# Patient Record
Sex: Female | Born: 1975 | Race: White | Hispanic: No | Marital: Married | Smoking: Never smoker
Health system: Southern US, Community
[De-identification: ages and names within clinical notes are randomized; demographics above are authoritative.]

## PROBLEM LIST (undated history)

## (undated) DIAGNOSIS — Z973 Presence of spectacles and contact lenses: Secondary | ICD-10-CM

## (undated) DIAGNOSIS — R102 Pelvic and perineal pain: Secondary | ICD-10-CM

## (undated) DIAGNOSIS — K760 Fatty (change of) liver, not elsewhere classified: Secondary | ICD-10-CM

## (undated) DIAGNOSIS — N920 Excessive and frequent menstruation with regular cycle: Secondary | ICD-10-CM

## (undated) DIAGNOSIS — R42 Dizziness and giddiness: Secondary | ICD-10-CM

## (undated) DIAGNOSIS — G43909 Migraine, unspecified, not intractable, without status migrainosus: Secondary | ICD-10-CM

## (undated) HISTORY — PX: MOUTH SURGERY: SHX715

## (undated) HISTORY — PX: WISDOM TOOTH EXTRACTION: SHX21

## (undated) HISTORY — DX: Fatty (change of) liver, not elsewhere classified: K76.0

## (undated) HISTORY — DX: Migraine, unspecified, not intractable, without status migrainosus: G43.909

## (undated) HISTORY — PX: NOSE SURGERY: SHX723

## (undated) HISTORY — PX: APPENDECTOMY: SHX54

## (undated) HISTORY — DX: Dizziness and giddiness: R42

## (undated) HISTORY — PX: OTHER SURGICAL HISTORY: SHX169

## (undated) HISTORY — PX: TONSILLECTOMY: SUR1361

---

## 2000-01-21 HISTORY — PX: APPENDECTOMY: SHX54

## 2006-12-03 ENCOUNTER — Encounter: Admission: RE | Admit: 2006-12-03 | Discharge: 2006-12-03 | Payer: Self-pay | Admitting: Obstetrics & Gynecology

## 2006-12-22 ENCOUNTER — Encounter: Admission: RE | Admit: 2006-12-22 | Discharge: 2006-12-22 | Payer: Self-pay | Admitting: Obstetrics & Gynecology

## 2007-06-28 ENCOUNTER — Encounter: Admission: RE | Admit: 2007-06-28 | Discharge: 2007-06-28 | Payer: Self-pay | Admitting: Obstetrics & Gynecology

## 2008-01-10 ENCOUNTER — Ambulatory Visit: Payer: Self-pay | Admitting: Genetic Counselor

## 2008-01-25 ENCOUNTER — Encounter: Admission: RE | Admit: 2008-01-25 | Discharge: 2008-01-25 | Payer: Self-pay | Admitting: Obstetrics & Gynecology

## 2008-03-08 ENCOUNTER — Ambulatory Visit: Payer: Self-pay | Admitting: Genetic Counselor

## 2010-02-12 ENCOUNTER — Encounter
Admission: RE | Admit: 2010-02-12 | Discharge: 2010-02-12 | Payer: Self-pay | Source: Home / Self Care | Attending: Obstetrics & Gynecology | Admitting: Obstetrics & Gynecology

## 2012-03-08 ENCOUNTER — Other Ambulatory Visit: Payer: Self-pay | Admitting: Obstetrics & Gynecology

## 2012-03-08 DIAGNOSIS — Z1231 Encounter for screening mammogram for malignant neoplasm of breast: Secondary | ICD-10-CM

## 2012-04-06 ENCOUNTER — Ambulatory Visit: Payer: Self-pay

## 2012-04-29 ENCOUNTER — Ambulatory Visit
Admission: RE | Admit: 2012-04-29 | Discharge: 2012-04-29 | Disposition: A | Discharge status: Home / Self Care | Payer: Managed Care, Other (non HMO) | Source: Ambulatory Visit | Attending: Obstetrics & Gynecology | Admitting: Obstetrics & Gynecology

## 2012-04-29 DIAGNOSIS — Z1231 Encounter for screening mammogram for malignant neoplasm of breast: Secondary | ICD-10-CM

## 2012-09-30 ENCOUNTER — Encounter: Payer: Self-pay | Admitting: Nurse Practitioner

## 2012-10-01 ENCOUNTER — Encounter: Payer: Self-pay | Admitting: Neurology

## 2012-10-01 ENCOUNTER — Ambulatory Visit (INDEPENDENT_AMBULATORY_CARE_PROVIDER_SITE_OTHER): Payer: Managed Care, Other (non HMO) | Admitting: Neurology

## 2012-10-01 VITALS — BP 117/76 | HR 59 | Ht 65.5 in | Wt 150.0 lb

## 2012-10-01 DIAGNOSIS — G43909 Migraine, unspecified, not intractable, without status migrainosus: Secondary | ICD-10-CM | POA: Insufficient documentation

## 2012-10-01 DIAGNOSIS — R002 Palpitations: Secondary | ICD-10-CM

## 2012-10-01 DIAGNOSIS — R42 Dizziness and giddiness: Secondary | ICD-10-CM

## 2012-10-01 NOTE — Patient Instructions (Addendum)
Overall you are doing fairly well but I do want to suggest a few things today:   Remember to drink plenty of fluid, eat healthy meals and do not skip any meals. Try to eat protein with a every meal and eat a healthy snack such as fruit or nuts in between meals. Try to keep a regular sleep-wake schedule and try to exercise daily, particularly in the form of walking, 20-30 minutes a day, if you can.   As far as diagnostic testing: I would like to check your thyroid today. I also suggest you follow up with cardiology.  I would like to see you back once your workup is completed, sooner if we need to. Please call us with any interim questions, concerns, problems, updates or refill requests.   Please also call us for any test results so we can go over those with you on the phone.  My clinical assistant and will answer any of your questions and relay your messages to me and also relay most of my messages to you.   Our phone number is [phone]. We also have an after hours call service for urgent matters and there is a physician on-call for urgent questions. For any emergencies you know to call 911 or go to the nearest emergency room

## 2012-10-01 NOTE — Progress Notes (Signed)
[facility]  Provider:  Dr Hosie Poisson Referring Provider: Serena Colonel, MD Primary Care Physician:  Sissy Hoff, MD  CC:  Frequent episodes of lightheadedness   HPI:  Kirsten Coleman is a 37 y.o. female here as a referral from Dr. Pollyann Kennedy for evaluation of lightheadedness. Was seen by ENT with no signs of an inner ear problem.   Symptoms started in July, starts as sensation of feeling fatigued and feel like she is going to pass out, happens randomly throughout the day, during nighttime also. Now happening 1-2 times per week. Typically last 1-3 minutes, has never last longer than 5 minutes. During event she feels disconnected. Will occasionally have episodes of blurry vision but not tied to the event. She does note some palpitations and sensation of feeling her heart beat during these events. Feels very tired after these events. During the event she can appropriately interact with people, no automatisms. She feels warm during the events flushed. No history of migraine. Will occasionally notice a tension type headache after the event. No associated photo or phonophobia, no nausea or vomiting. No sensory or motor changes during the events. Denies any triggers. During the event she sits down and wait until it is over. Has not actually ever passed out during the event.  Doesn't drink caffeine. No change in medications.   Otherwise healthy. No history of heart problems, no migraines. No excessive stress, depression, anxiety.   Review of Systems: Out of a complete 14 system review, the patient complains of only the following symptoms, and all other reviewed systems are negative. Positive for blurred vision fatigue palpitations dizziness numbness headache decreased energy  History   Social History  . Marital Status: Married    Spouse Name: [name]    Number of Children: 2  . Years of Education: BS   Occupational History  .      [employer]   Social History Main Topics  .  Smoking status: Never Smoker   . Smokeless tobacco: Never Used  . Alcohol Use: 0.6 oz/week    1 Glasses of wine per week     Comment: social   . Drug Use: No  . Sexual Activity: Not on file   Other Topics Concern  . Not on file   Social History Narrative   Patient lives at home with her husband [name]). Patient has college education. B.S.   Caffeine- None-   Right handed.    Family History  Problem Relation Age of Onset  . Alzheimer's disease      grandfather  . Hypertension Father   . Lung cancer      uncle  . Rheum arthritis      grandmother    Past Medical History  Diagnosis Date  . Light headedness   . Migraine     Past Surgical History  Procedure Laterality Date  . Appendectomy    . Nose surgery    . Mouth surgery    . Tonsillectomy      Current Outpatient Prescriptions  Medication Sig Dispense Refill  . Multiple Vitamins-Minerals (MULTIVITAMIN PO) Take by mouth daily.       No current facility-administered medications for this visit.    Allergies as of 10/01/2012 - Review Complete 10/01/2012  Allergen Reaction Noted  . Bactrim [sulfamethoxazole w-trimethoprim]  09/30/2012    Vitals: BP 117/76  Pulse 59  Ht 5' 5.5" (1.664 m)  Wt 150 lb (68.04 kg)  BMI 24.57 kg/m2 Last Weight:  Wt Readings from  Last 1 Encounters:  10/01/12 150 lb (68.04 kg)   Last Height:   Ht Readings from Last 1 Encounters:  10/01/12 5' 5.5" (1.664 m)     Physical exam: Exam: Gen: NAD, conversant Eyes: anicteric sclerae, moist conjunctivae HENT: [place] Neck: Trachea midline; supple,  Lungs: CTA, no wheezing, rales, rhonic                          CV: RRR, no MRG Abdomen: Soft, non-tender;  Extremities: No peripheral edema  Skin: Normal temperature, no rash,  Psych: Appropriate affect, pleasant  Neuro: MS: AA&Ox3, appropriately interactive, normal affect   Attention: WORLD backwards  Speech: fluent w/o paraphasic error  Memory: good recent and remote  recall  CN: PERRL, EOMI no nystagmus,VFF to FC bilat,  no ptosis, sensation intact to LT V1-V3 bilat, face symmetric, no weakness, hearing grossly intact, palate elevates symmetrically, shoulder shrug 5/5 bilat,  tongue protrudes midline, no fasiculations noted.  Motor: normal bulk and tone Strength: 5/5  In all extremities  Coord: rapid alternating and point-to-point (FNF, HTS) movements intact.  Reflexes: symmetrical, bilat downgoing toes  Sens: LT intact in all extremities  Gait: posture, stance, stride and arm-swing normal. Tandem gait intact. Able to walk on heels and toes. Romberg absent.   Assessment:  After physical and neurologic examination, review of laboratory studies, imaging, neurophysiology testing and pre-existing records, assessment will be reviewed on the problem list.  Plan:  Treatment plan and additional workup will be reviewed under Problem List.  [name] is a pleasant 37 year old woman who presents for initial evaluation of episodes of lightheadedness. These began in July, last around 1-3 minutes and have been occurring at a frequency of one to 2 times per week. With these events feel that she is going to pass out, feels flushed and gets palpitations. Occasional has a tension type headache after the event. No history of migraines. Was evaluated by ENT with normal exam. Physical exam. Unremarkable. No signs of orthostatic changes in blood pressure. Unclear etiology of these events. With history of palpitations in subjective awareness of her heartbeat would consider cardiac etiology. Will check thyroid level today, consultation to followup with cardiology for possible Holter monitoring. This potentially could represent an atypical migraine, though would want to rule out cardiac etiology prior to making this diagnosis.  1) lightheadedness: r/o cardiac etiology vs thryoid vs possible atypical migraine  -check TSH  -follow up with cardiology -with above normal would  consider atypical migraine and discuss treatment options with patient

## 2012-10-11 ENCOUNTER — Telehealth: Payer: Self-pay | Admitting: Neurology

## 2012-10-13 ENCOUNTER — Other Ambulatory Visit: Payer: Self-pay | Admitting: Neurology

## 2012-10-13 DIAGNOSIS — R002 Palpitations: Secondary | ICD-10-CM

## 2012-10-13 NOTE — Telephone Encounter (Signed)
Called patient back and placed referral for cardiology

## 2012-10-13 NOTE — Telephone Encounter (Signed)
I called and LMVM for pt that last office notes states relating to TSH (normal) and then cardiology for holter monitoring?  This already scheduled from before? TBS?

## 2012-11-10 ENCOUNTER — Ambulatory Visit: Payer: Managed Care, Other (non HMO) | Admitting: Cardiology

## 2012-11-12 ENCOUNTER — Encounter: Payer: Self-pay | Admitting: Cardiology

## 2012-11-12 ENCOUNTER — Ambulatory Visit (INDEPENDENT_AMBULATORY_CARE_PROVIDER_SITE_OTHER): Payer: Managed Care, Other (non HMO) | Admitting: Cardiology

## 2012-11-12 VITALS — BP 110/82 | HR 66 | Ht 65.5 in | Wt 152.0 lb

## 2012-11-12 DIAGNOSIS — R55 Syncope and collapse: Secondary | ICD-10-CM

## 2012-11-12 NOTE — Progress Notes (Signed)
Patient ID: Gelisa Tieken, female   DOB: 1975/03/22, 37 y.o.   MRN: [id number]    Patient Name: Kirsten Coleman Date of Encounter: 11/12/2012  Primary Care Provider:  Sissy Hoff, MD Primary Cardiologist:  Tobias Alexander, H  Patient Profile  Palpitation  Problem List   Past Medical History  Diagnosis Date  . Light headedness   . Migraine    Past Surgical History  Procedure Laterality Date  . Appendectomy    . Nose surgery    . Mouth surgery    . Tonsillectomy      Allergies  Allergies  Allergen Reactions  . 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 The patient states that starting July she started to have episodes of sudden onset blurred vision, weakness, and feeling that she is going to pass out. No syncopal episodes. They are always followed by a headache. The last from 1 minute to 15 minutes. No obvious precipitating or alleviating factors. It can happen at any time while she's driving or at night even when she is swimming. They have been approximately every 2-3 days. They are not associated with shortness of breath or chest pain. No palpitations. One time at work when she felt like this her coworkers checked her blood pressure heart rate and blood sugar ankles all normal.  Home Medications  Prior to Admission medications   Medication Sig Start Date End Date Taking? Authorizing Provider  Multiple Vitamins-Minerals (MULTIVITAMIN PO) Take by mouth daily.   Yes Historical Provider, MD   Family History  Family History  Problem Relation Age of Onset  . Alzheimer's disease      grandfather  . Hypertension Father   . Lung cancer      uncle  . Rheum arthritis      grandmother    Social History  History   Social History  . Marital Status: Married    Spouse Name: [name]    Number of Children: 2  . Years of Education: BS   Occupational History  .     [employer]   Social History Main Topics  . Smoking status: Never Smoker   . Smokeless tobacco: Never Used  . Alcohol Use: 0.6 oz/week    1 Glasses of wine per week     Comment: social   . Drug Use: No  . Sexual Activity: Not on file   Other Topics Concern  . Not on file   Social History Narrative   Patient lives at home with her husband [name]). Patient has college education. B.S.   Caffeine- None-   Right handed.    Review of Systems, as per history of present illness otherwise negative General:  No chills, fever, night sweats or weight changes.  Cardiovascular:  No chest pain, dyspnea on exertion, edema, orthopnea, palpitations, paroxysmal nocturnal dyspnea. Dermatological: No rash, lesions/masses Respiratory: No cough, dyspnea Urologic: No hematuria, dysuria Abdominal:   No nausea, vomiting, diarrhea, bright red blood per rectum, melena, or hematemesis Neurologic:  No visual changes, wkns, changes in mental status. All other systems reviewed and are otherwise negative except as noted above.  Physical Exam  Blood pressure 110/82, pulse 66, height 5' 5.5" (1.664 m), weight 152 lb (68.947 kg).  General: Pleasant, NAD Psych: Normal affect. Neuro: Alert and oriented X 3. Moves all extremities spontaneously. HEENT: Normal  Neck: Supple without bruits or JVD. Lungs:  Resp regular and unlabored, CTA. Heart:  RRR no s3, s4, or murmurs. Abdomen: Soft, non-tender, non-distended, BS + x 4.  Extremities: No clubbing, cyanosis or edema. DP/PT/Radials 2+ and equal bilaterally.  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 It doesn't appeared to be symptoms are cardiac in origin but rather migraines with aura or seizure activity. She has a  normal cardiac exam, normal EKG. The most part the patient on cardiac monitor to monitor her EKG and until she has one or 2 of those episodes and we will review with possible underlying arrhythmias. If you rule out arrhythmia S. she will need to be followed by neurologist.  Once we review the cardio cardiac monitor we will call patient with the results and schedule another followup visit if necessary.   Tobias Alexander, Rexene Edison, MD 11/12/2012, 4:34 PM

## 2012-11-12 NOTE — Patient Instructions (Signed)
Your physician has recommended that you wear an event monitor. Event monitors are medical devices that record the heart's electrical activity. Doctors most often [place] these monitors to diagnose arrhythmias. Arrhythmias are problems with the speed or rhythm of the heartbeat. The monitor is a small, portable device. You can wear one while you do your normal daily activities. This is usually used to diagnose what is causing palpitations/syncope (passing out). Pt will wear until she has an event then take off and return monitor.  Your physician recommends that you schedule a follow-up appointment in: we will contact you concerning monitor

## 2012-11-15 ENCOUNTER — Encounter (INDEPENDENT_AMBULATORY_CARE_PROVIDER_SITE_OTHER): Payer: Managed Care, Other (non HMO)

## 2012-11-15 ENCOUNTER — Encounter: Payer: Self-pay | Admitting: Radiology

## 2012-11-15 DIAGNOSIS — R55 Syncope and collapse: Secondary | ICD-10-CM

## 2012-11-15 NOTE — Progress Notes (Signed)
Patient ID: Kirsten Coleman, female   DOB: 10-19-1975, 37 y.o.   MRN: [id number] lifewatch 30 day monitor applied

## 2012-11-25 ENCOUNTER — Other Ambulatory Visit: Payer: Self-pay

## 2012-12-20 ENCOUNTER — Telehealth: Payer: Self-pay

## 2012-12-20 NOTE — Telephone Encounter (Signed)
Pt is advised, per Dr Delton See, that her event monitor results were normal, she verbalized understanding. Per Pt request a copy of event mon. results have been faxed to Dr. Hosie Poisson at [phone].

## 2012-12-21 ENCOUNTER — Telehealth: Payer: Self-pay | Admitting: Neurology

## 2012-12-22 NOTE — Telephone Encounter (Signed)
Patient called back and recent cardiology results discussed. Will start patient on Petadolex capsules for migraine. Patient prefers naturopathic medications.

## 2014-01-06 ENCOUNTER — Other Ambulatory Visit: Payer: Self-pay | Admitting: Obstetrics & Gynecology

## 2014-01-06 DIAGNOSIS — N63 Unspecified lump in unspecified breast: Secondary | ICD-10-CM

## 2014-01-19 ENCOUNTER — Other Ambulatory Visit: Payer: Managed Care, Other (non HMO)

## 2014-01-26 ENCOUNTER — Ambulatory Visit
Admission: RE | Admit: 2014-01-26 | Discharge: 2014-01-26 | Disposition: A | Discharge status: Home / Self Care | Payer: Managed Care, Other (non HMO) | Source: Ambulatory Visit | Attending: Obstetrics & Gynecology | Admitting: Obstetrics & Gynecology

## 2014-01-26 DIAGNOSIS — N63 Unspecified lump in unspecified breast: Secondary | ICD-10-CM

## 2014-08-01 ENCOUNTER — Encounter: Payer: Self-pay | Admitting: Genetic Counselor

## 2014-10-30 ENCOUNTER — Other Ambulatory Visit: Payer: Self-pay | Admitting: Obstetrics & Gynecology

## 2014-10-31 ENCOUNTER — Encounter (HOSPITAL_COMMUNITY)
Admission: AD | Disposition: A | Discharge status: Home / Self Care | Payer: Self-pay | Source: Ambulatory Visit | Attending: Obstetrics & Gynecology

## 2014-10-31 ENCOUNTER — Ambulatory Visit (HOSPITAL_COMMUNITY)
Admission: AD | Admit: 2014-10-31 | Discharge: 2014-10-31 | Disposition: A | Discharge status: Home / Self Care | Payer: Managed Care, Other (non HMO) | Source: Ambulatory Visit | Attending: Obstetrics & Gynecology | Admitting: Obstetrics & Gynecology

## 2014-10-31 ENCOUNTER — Ambulatory Visit (HOSPITAL_COMMUNITY): Payer: Managed Care, Other (non HMO) | Admitting: Certified Registered Nurse Anesthetist

## 2014-10-31 ENCOUNTER — Encounter (HOSPITAL_COMMUNITY): Payer: Self-pay | Admitting: Certified Registered Nurse Anesthetist

## 2014-10-31 DIAGNOSIS — N840 Polyp of corpus uteri: Secondary | ICD-10-CM | POA: Diagnosis not present

## 2014-10-31 HISTORY — PX: DILITATION & CURRETTAGE/HYSTROSCOPY WITH VERSAPOINT RESECTION: SHX5571

## 2014-10-31 LAB — CBC
HEMATOCRIT: 41.7 % (ref 36.0–46.0)
HEMOGLOBIN: 13.8 g/dL (ref 12.0–15.0)
MCH: 30.9 pg (ref 26.0–34.0)
MCHC: 33.1 g/dL (ref 30.0–36.0)
MCV: 93.5 fL (ref 78.0–100.0)
Platelets: 220 10*3/uL (ref 150–400)
RBC: 4.46 MIL/uL (ref 3.87–5.11)
RDW: 13.7 % (ref 11.5–15.5)
WBC: 6.5 10*3/uL (ref 4.0–10.5)

## 2014-10-31 SURGERY — DILATATION & CURETTAGE/HYSTEROSCOPY WITH VERSAPOINT RESECTION
Anesthesia: General | Site: Vagina

## 2014-10-31 MED ORDER — HYDROCODONE-ACETAMINOPHEN 7.5-325 MG PO TABS
ORAL_TABLET | ORAL | Status: AC
  Filled 2014-10-31: qty 1

## 2014-10-31 MED ORDER — CEFAZOLIN SODIUM-DEXTROSE 2-3 GM-% IV SOLR
INTRAVENOUS | Status: AC
  Filled 2014-10-31: qty 50

## 2014-10-31 MED ORDER — MIDAZOLAM HCL 2 MG/2ML IJ SOLN
INTRAMUSCULAR | Status: DC | PRN
  Administered 2014-10-31: 2 mg via INTRAVENOUS

## 2014-10-31 MED ORDER — KETOROLAC TROMETHAMINE 30 MG/ML IJ SOLN
INTRAMUSCULAR | Status: DC | PRN
  Administered 2014-10-31: 30 mg via INTRAVENOUS

## 2014-10-31 MED ORDER — MEPERIDINE HCL 25 MG/ML IJ SOLN: 6.2500 mg | INTRAMUSCULAR | Status: DC | PRN

## 2014-10-31 MED ORDER — SCOPOLAMINE 1 MG/3DAYS TD PT72
1.0000 | MEDICATED_PATCH | Freq: Once | TRANSDERMAL | Status: DC
  Administered 2014-10-31: 1.5 mg via TRANSDERMAL

## 2014-10-31 MED ORDER — GLYCOPYRROLATE 0.2 MG/ML IJ SOLN
INTRAMUSCULAR | Status: DC | PRN
  Administered 2014-10-31 (×2): 0.1 mg via INTRAVENOUS

## 2014-10-31 MED ORDER — SCOPOLAMINE 1 MG/3DAYS TD PT72
MEDICATED_PATCH | TRANSDERMAL | Status: AC
  Administered 2014-10-31: 1.5 mg via TRANSDERMAL
  Filled 2014-10-31: qty 1

## 2014-10-31 MED ORDER — PROPOFOL 10 MG/ML IV BOLUS
INTRAVENOUS | Status: DC | PRN
  Administered 2014-10-31: 170 mg via INTRAVENOUS

## 2014-10-31 MED ORDER — CEFAZOLIN SODIUM-DEXTROSE 2-3 GM-% IV SOLR
2.0000 g | INTRAVENOUS | Status: AC
  Administered 2014-10-31: 2 g via INTRAVENOUS

## 2014-10-31 MED ORDER — PROPOFOL 10 MG/ML IV BOLUS
INTRAVENOUS | Status: AC
  Filled 2014-10-31: qty 20

## 2014-10-31 MED ORDER — LACTATED RINGERS IV SOLN
INTRAVENOUS | Status: DC
  Administered 2014-10-31: 125 mL/h via INTRAVENOUS

## 2014-10-31 MED ORDER — MIDAZOLAM HCL 2 MG/2ML IJ SOLN
INTRAMUSCULAR | Status: AC
  Filled 2014-10-31: qty 4

## 2014-10-31 MED ORDER — METOCLOPRAMIDE HCL 5 MG/ML IJ SOLN: 10.0000 mg | Freq: Once | INTRAMUSCULAR | Status: DC | PRN

## 2014-10-31 MED ORDER — FENTANYL CITRATE (PF) 100 MCG/2ML IJ SOLN
25.0000 ug | INTRAMUSCULAR | Status: DC | PRN
  Administered 2014-10-31: 50 ug via INTRAVENOUS

## 2014-10-31 MED ORDER — ONDANSETRON HCL 4 MG/2ML IJ SOLN
INTRAMUSCULAR | Status: AC
  Filled 2014-10-31: qty 2

## 2014-10-31 MED ORDER — KETOROLAC TROMETHAMINE 30 MG/ML IJ SOLN
INTRAMUSCULAR | Status: AC
Start: 2014-10-31 — End: 2014-10-31
  Filled 2014-10-31: qty 1

## 2014-10-31 MED ORDER — DEXAMETHASONE SODIUM PHOSPHATE 10 MG/ML IJ SOLN
INTRAMUSCULAR | Status: DC | PRN
  Administered 2014-10-31: 4 mg via INTRAVENOUS

## 2014-10-31 MED ORDER — ONDANSETRON HCL 4 MG/2ML IJ SOLN
INTRAMUSCULAR | Status: DC | PRN
  Administered 2014-10-31: 4 mg via INTRAVENOUS

## 2014-10-31 MED ORDER — FENTANYL CITRATE (PF) 100 MCG/2ML IJ SOLN
INTRAMUSCULAR | Status: DC | PRN
  Administered 2014-10-31: 100 ug via INTRAVENOUS

## 2014-10-31 MED ORDER — HYDROCODONE-ACETAMINOPHEN 7.5-325 MG PO TABS
1.0000 | ORAL_TABLET | Freq: Once | ORAL | Status: AC | PRN
  Administered 2014-10-31: 1 via ORAL

## 2014-10-31 MED ORDER — CHLOROPROCAINE HCL 1 % IJ SOLN
INTRAMUSCULAR | Status: AC
  Filled 2014-10-31: qty 30

## 2014-10-31 MED ORDER — SODIUM CHLORIDE 0.9 % IR SOLN
Status: DC | PRN
  Administered 2014-10-31: 3000 mL

## 2014-10-31 MED ORDER — FENTANYL CITRATE (PF) 100 MCG/2ML IJ SOLN
INTRAMUSCULAR | Status: AC
  Filled 2014-10-31: qty 2

## 2014-10-31 MED ORDER — DEXAMETHASONE SODIUM PHOSPHATE 4 MG/ML IJ SOLN
INTRAMUSCULAR | Status: AC
  Filled 2014-10-31: qty 1

## 2014-10-31 MED ORDER — HYDROCODONE-IBUPROFEN 5-200 MG PO TABS
1.0000 | ORAL_TABLET | Freq: Three times a day (TID) | ORAL | Status: DC | PRN
Start: 2014-10-31 — End: 2016-04-25

## 2014-10-31 MED ORDER — FENTANYL CITRATE (PF) 100 MCG/2ML IJ SOLN
INTRAMUSCULAR | Status: AC
  Filled 2014-10-31: qty 4

## 2014-10-31 MED ORDER — LIDOCAINE HCL (CARDIAC) 20 MG/ML IV SOLN
INTRAVENOUS | Status: DC | PRN
  Administered 2014-10-31: 50 mg via INTRAVENOUS

## 2014-10-31 SURGICAL SUPPLY — 16 items
CANISTER SUCT 3000ML (MISCELLANEOUS) ×2 IMPLANT
CATH ROBINSON RED A/P 16FR (CATHETERS) ×2 IMPLANT
CLOTH BEACON ORANGE TIMEOUT ST (SAFETY) ×2 IMPLANT
CONTAINER PREFILL 10% NBF 60ML (FORM) ×2 IMPLANT
ELECTRODE ROLLER VERSAPOINT (ELECTRODE) IMPLANT
ELECTRODE RT ANGLE VERSAPOINT (CUTTING LOOP) ×2 IMPLANT
GLOVE BIO SURGEON STRL SZ 6.5 (GLOVE) ×2 IMPLANT
GLOVE BIOGEL PI IND STRL 7.0 (GLOVE) ×2 IMPLANT
GLOVE BIOGEL PI INDICATOR 7.0 (GLOVE) ×2
GOWN STRL REUS W/TWL LRG LVL3 (GOWN DISPOSABLE) ×6 IMPLANT
PACK VAGINAL MINOR WOMEN LF (CUSTOM PROCEDURE TRAY) ×2 IMPLANT
PAD OB MATERNITY 4.3X12.25 (PERSONAL CARE ITEMS) ×2 IMPLANT
TOWEL OR 17X24 6PK STRL BLUE (TOWEL DISPOSABLE) ×4 IMPLANT
TUBING AQUILEX INFLOW (TUBING) ×2 IMPLANT
TUBING AQUILEX OUTFLOW (TUBING) ×2 IMPLANT
WATER STERILE IRR 1000ML POUR (IV SOLUTION) ×2 IMPLANT

## 2014-10-31 NOTE — Discharge Summary (Signed)
  Physician Discharge Summary  Patient ID: Norleen Xie MRN: [id number] DOB/AGE: [birth year] 39 y.o.  Admit date: 10/31/2014 Discharge date: 10/31/2014  Admission Diagnoses: Intrauterine Polyp  Discharge Diagnoses: Intrauterine Polyp        Active Problems:   * No active hospital problems. *   Discharged Condition: good  Hospital Course:  Outpatient  Consults: None  Treatments: surgery: Hysteroscopy with Resection of Endometrial Polyps and D+C  Disposition: Final discharge disposition not confirmed     Medication List    TAKE these medications        CO Q 10 PO  Take 1 capsule by mouth daily.     FISH OIL PO  Take 1 capsule by mouth daily.     hydrocodone-ibuprofen 5-200 MG tablet  Commonly known as:  VICOPROFEN  Take 1 tablet by mouth every 8 (eight) hours as needed for pain.     multivitamin with minerals Tabs tablet  Take 1 tablet by mouth 2 (two) times daily.     OVER THE COUNTER MEDICATION  Take 1 tablet by mouth 2 (two) times daily as needed (for cramping). Herbal Supplement: Curamin for pain/cramping           Follow-up Information    Follow up with Graciela Plato,MARIE-LYNE, MD In 3 weeks.   Specialty:  Obstetrics and Gynecology   Contact information:   [address] [place] [address] [phone]       Signed: Princess Bruins, MD 10/31/2014, 12:04 PM

## 2014-10-31 NOTE — Discharge Instructions (Signed)
Hysteroscopy, Care After Refer to this sheet in the next few weeks. These instructions provide you with information on caring for yourself after your procedure. Your health care provider may also give you more specific instructions. Your treatment has been planned according to current medical practices, but problems sometimes occur. Call your health care provider if you have any problems or questions after your procedure.  WHAT TO EXPECT AFTER THE PROCEDURE After your procedure, it is typical to have the following:  You may have some cramping. This normally lasts for a couple days.  You may have bleeding. This can vary from light spotting for a few days to menstrual-like bleeding for 3-7 days. HOME CARE INSTRUCTIONS  Rest for the first 1-2 days after the procedure.  Only take over-the-counter or prescription medicines as directed by your health care provider. Do not take aspirin. It can increase the chances of bleeding.  Take showers instead of baths for 2 weeks or as directed by your health care provider.  Do not drive for 24 hours or as directed.  Do not drink alcohol while taking pain medicine.  Do not use tampons, douche, or have sexual intercourse for 2 weeks or until your health care provider says it is okay.  Take your temperature twice a day for 4-5 days. Write it down each time.  Follow your health care provider's advice about diet, exercise, and lifting.  If you develop constipation, you may:  Take a mild laxative if your health care provider approves.  Add bran foods to your diet.  Drink enough fluids to keep your urine clear or pale yellow.  Try to have someone with you or available to you for the first 24-48 hours, especially if you were given a general anesthetic.  Follow up with your health care provider as directed. SEEK MEDICAL CARE IF:  You feel dizzy or lightheaded.  You feel sick to your stomach (nauseous).  You have abnormal vaginal discharge.  You  have a rash.  You have pain that is not controlled with medicine. SEEK IMMEDIATE MEDICAL CARE IF:  You have bleeding that is heavier than a normal menstrual period.  You have a fever.  You have increasing cramps or pain, not controlled with medicine.  You have new belly (abdominal) pain.  You pass out.  You have pain in the tops of your shoulders (shoulder strap areas).  You have shortness of breath.   This information is not intended to replace advice given to you by your health care provider. Make sure you discuss any questions you have with your health care provider.   Document Released: 10/27/2012 Document Reviewed: 10/27/2012 Elsevier Interactive Patient Education [employer].

## 2014-10-31 NOTE — Transfer of Care (Signed)
Immediate Anesthesia Transfer of Care Note  Patient: Kirsten Coleman  Procedure(s) Performed: Procedure(s): DILATATION & CURETTAGE/HYSTEROSCOPY WITH VERSAPOINT RESECTION (N/A)  Patient Location: PACU  Anesthesia Type:General  Level of Consciousness: awake, alert  and oriented  Airway & Oxygen Therapy: Patient Spontanous Breathing and Patient connected to nasal cannula oxygen  Post-op Assessment: Report given to RN, Post -op Vital signs reviewed and stable and Patient moving all extremities  Post vital signs: Reviewed and stable  Last Vitals:  Filed Vitals:   10/31/14 0955  BP: 118/76  Pulse: 58  Temp: 36.6 C  Resp: 20    Complications: No apparent anesthesia complications

## 2014-10-31 NOTE — Anesthesia Procedure Notes (Addendum)
Procedure Name: LMA Insertion Date/Time: 10/31/2014 11:26 AM Performed by: Hewitt Blade Pre-anesthesia Checklist: Patient identified, Timeout performed, Emergency Drugs available, Suction available and Patient being monitored Patient Re-evaluated:Patient Re-evaluated prior to inductionOxygen Delivery Method: Circle system utilized Preoxygenation: Pre-oxygenation with 100% oxygen Intubation Type: IV induction Ventilation: Mask ventilation without difficulty LMA: LMA inserted LMA Size: 4.0 Tube type: Oral Number of attempts: 1 Placement Confirmation: ETT inserted through vocal cords under direct vision,  positive ETCO2 and breath sounds checked- equal and bilateral Tube secured with: Tape Dental Injury: Teeth and Oropharynx as per pre-operative assessment

## 2014-10-31 NOTE — Anesthesia Preprocedure Evaluation (Signed)
Anesthesia Evaluation  Patient identified by MRN, date of birth, ID band Patient awake    Reviewed: Allergy & Precautions, NPO status , Patient's Chart, lab work & pertinent test results  Airway Mallampati: II  TM Distance: >3 FB Neck ROM: Full    Dental no notable dental hx. (+) Teeth Intact   Pulmonary neg pulmonary ROS,    Pulmonary exam normal breath sounds clear to auscultation       Cardiovascular negative cardio ROS Normal cardiovascular exam Rhythm:Regular Rate:Normal     Neuro/Psych  Headaches, negative psych ROS   GI/Hepatic negative GI ROS, Neg liver ROS,   Endo/Other  negative endocrine ROS  Renal/GU negative Renal ROS  negative genitourinary   Musculoskeletal negative musculoskeletal ROS (+)   Abdominal   Peds  Hematology negative hematology ROS (+)   Anesthesia Other Findings   Reproductive/Obstetrics Endometrial polyp                             Anesthesia Physical Anesthesia Plan  ASA: II  Anesthesia Plan: General   Post-op Pain Management:    Induction: Intravenous  Airway Management Planned: LMA  Additional Equipment:   Intra-op Plan:   Post-operative Plan: Extubation in OR  Informed Consent: I have reviewed the patients History and Physical, chart, labs and discussed the procedure including the risks, benefits and alternatives for the proposed anesthesia with the patient or authorized representative who has indicated his/her understanding and acceptance.   Dental advisory given  Plan Discussed with: CRNA, Anesthesiologist and Surgeon  Anesthesia Plan Comments:         Anesthesia Quick Evaluation

## 2014-10-31 NOTE — H&P (Signed)
Kirsten Coleman is an 39 y.o. female  G73P2A1  RP:  IU polyp for [facility] Resection, D+C  Pertinent Gynecological History: Menses: flow is moderate Contraception: Vasectomy Blood transfusions: none Sexually transmitted diseases: no past history  Last pap: normal  OB History: G3P2A1   Menstrual History:  No LMP recorded.    Past Medical History  Diagnosis Date  . Light headedness   . Migraine     Past Surgical History  Procedure Laterality Date  . Appendectomy    . Nose surgery    . Mouth surgery    . Tonsillectomy      Family History  Problem Relation Age of Onset  . Alzheimer's disease      grandfather  . Hypertension Father   . Lung cancer      uncle  . Rheum arthritis      grandmother    Social History:  reports that she has never smoked. She has never used smokeless tobacco. She reports that she drinks about 0.6 oz of alcohol per week. She reports that she does not use illicit drugs.  Allergies:  Allergies  Allergen Reactions  . Bactrim [Sulfamethoxazole-Trimethoprim] Rash    Prescriptions prior to admission  Medication Sig Dispense Refill Last Dose  . Coenzyme Q10 (CO Q 10 PO) Take 1 capsule by mouth daily.   Past Week at Unknown time  . Multiple Vitamin (MULTIVITAMIN WITH MINERALS) TABS tablet Take 1 tablet by mouth 2 (two) times daily.   Past Week at Unknown time  . Omega-3 Fatty Acids (FISH OIL PO) Take 1 capsule by mouth daily.   Past Week at Unknown time  . OVER THE COUNTER MEDICATION Take 1 tablet by mouth 2 (two) times daily as needed (for cramping). Herbal Supplement: Curamin for pain/cramping   Past Week at Unknown time    ROS  Blood pressure 118/76, pulse 58, temperature 97.9 F (36.6 C), temperature source Oral, resp. rate 20, height 5\' 5"  (1.651 m), weight 135 lb (61.236 kg), SpO2 100 %. Physical Exam  Sonohysto:  IU lesion 8 mm c/w polyp   Results for orders placed or performed during the hospital encounter of 10/31/14 (from the past 24  hour(s))  CBC     Status: None   Collection Time: 10/31/14  9:46 AM  Result Value Ref Range   WBC 6.5 4.0 - 10.5 K/uL   RBC 4.46 3.87 - 5.11 MIL/uL   Hemoglobin 13.8 12.0 - 15.0 g/dL   HCT 41.7 36.0 - 46.0 %   MCV 93.5 78.0 - 100.0 fL   MCH 30.9 26.0 - 34.0 pg   MCHC 33.1 30.0 - 36.0 g/dL   RDW 13.7 11.5 - 15.5 %   Platelets 220 150 - 400 K/uL    Assessment/Plan: IU polyp for [facility] Resection, D+C.  Surgery and risks reviewed.  Lennie Dunnigan,MARIE-LYNE 10/31/2014, 11:09 AM

## 2014-10-31 NOTE — Op Note (Signed)
10/31/2014  11:53 AM  PATIENT:  Kirsten Coleman  39 y.o. female  PRE-OPERATIVE DIAGNOSIS:  Intrauterine Polyp  POST-OPERATIVE DIAGNOSIS:  Intrauterine Polyp  PROCEDURE:  Procedure(s): DILATATION & CURETTAGE/HYSTEROSCOPY WITH VERSAPOINT RESECTION  SURGEON:  Surgeon(s): Princess Bruins, MD  ASSISTANTS: none   ANESTHESIA:   general   PROCEDURE:  Under general anesthesia with laryngeal mask, the patient is an lithotomy position. She is prepped with Betadine on the suprapubic, vulvar and vaginal areas.The bladder is catheterized. The patient is draped as usual. The vaginal exam reveals an anteverted uterus mobile no adnexal masses. The speculum is inserted in the vagina and the anterior lip of the cervix is grasped with a tenaculum. Dilation of the cervix with Hegar dilators up to #33 without difficulty. The operative hysteroscope is introduced with the VersaPoint loop.  Pictures are taken of the intrauterine cavity including both ostia and the 2 intrauterine polyps.   The 2 small intrauterine polyps are localized at the fundus and at the left lower anterior wall of the uterus, both are resected easily.  Both specimens are removed from the intrauterine cavity and the hysteroscope is removed also.  We proceed with a systematic curettage of the intrauterine cavity on all surfaces with a sharp curet.  The resection specimens and the endometrial curettings are sent together to pathology.  We then go back with the hysteroscope and the intrauterine cavity.  We confirm a normal cavity with no lesion and good hemostasis.  The hysteroscope was therefore removed as well as the tenaculum and the speculum.  The patient is brought to recovery room in good and stable status.   ESTIMATED BLOOD LOSS:  5 cc  FLUID DEFICIT:  100 cc   Intake/Output Summary (Last 24 hours) at 10/31/14 1153 Last data filed at 10/31/14 1148  Gross per 24 hour  Intake      0 ml  Output     30 ml  Net    -30 ml     BLOOD  ADMINISTERED:none   LOCAL MEDICATIONS USED:  NONE  SPECIMEN:  Source of Specimen:  Resection of Endometrial polyp and Endometrial curettings  DISPOSITION OF SPECIMEN:  PATHOLOGY  COUNTS:  YES  PLAN OF CARE: Transfer to PACU  Princess Bruins MD  10/31/2014  At 11:54 am

## 2014-11-01 ENCOUNTER — Encounter (HOSPITAL_COMMUNITY): Payer: Self-pay | Admitting: Obstetrics & Gynecology

## 2014-11-06 NOTE — Anesthesia Postprocedure Evaluation (Signed)
  Anesthesia Post-op Note  Patient: Kirsten Coleman  Procedure(s) Performed: Procedure(s): DILATATION & CURETTAGE/HYSTEROSCOPY WITH VERSAPOINT RESECTION (N/A)  Patient Location: PACU  Anesthesia Type:General  Level of Consciousness: awake, alert  and oriented  Airway and Oxygen Therapy: Patient Spontanous Breathing  Post-op Pain: none  Post-op Assessment: Post-op Vital signs reviewed.                Post-op Vital Signs: Reviewed and stable  Last Vitals:  Filed Vitals:   10/31/14 1330  BP: 110/74  Pulse: 47  Temp: 36.6 C  Resp: 14    Complications: No apparent anesthesia complications

## 2016-03-13 DIAGNOSIS — Z1231 Encounter for screening mammogram for malignant neoplasm of breast: Secondary | ICD-10-CM | POA: Diagnosis not present

## 2016-04-09 DIAGNOSIS — M9903 Segmental and somatic dysfunction of lumbar region: Secondary | ICD-10-CM | POA: Diagnosis not present

## 2016-04-09 DIAGNOSIS — M62830 Muscle spasm of back: Secondary | ICD-10-CM | POA: Diagnosis not present

## 2016-04-09 DIAGNOSIS — M9905 Segmental and somatic dysfunction of pelvic region: Secondary | ICD-10-CM | POA: Diagnosis not present

## 2016-04-09 DIAGNOSIS — M9902 Segmental and somatic dysfunction of thoracic region: Secondary | ICD-10-CM | POA: Diagnosis not present

## 2016-04-09 DIAGNOSIS — M7042 Prepatellar bursitis, left knee: Secondary | ICD-10-CM | POA: Diagnosis not present

## 2016-04-11 DIAGNOSIS — M62830 Muscle spasm of back: Secondary | ICD-10-CM | POA: Diagnosis not present

## 2016-04-11 DIAGNOSIS — M9903 Segmental and somatic dysfunction of lumbar region: Secondary | ICD-10-CM | POA: Diagnosis not present

## 2016-04-11 DIAGNOSIS — M7042 Prepatellar bursitis, left knee: Secondary | ICD-10-CM | POA: Diagnosis not present

## 2016-04-11 DIAGNOSIS — M9905 Segmental and somatic dysfunction of pelvic region: Secondary | ICD-10-CM | POA: Diagnosis not present

## 2016-04-11 DIAGNOSIS — M9902 Segmental and somatic dysfunction of thoracic region: Secondary | ICD-10-CM | POA: Diagnosis not present

## 2016-04-14 DIAGNOSIS — M7042 Prepatellar bursitis, left knee: Secondary | ICD-10-CM | POA: Diagnosis not present

## 2016-04-14 DIAGNOSIS — M9905 Segmental and somatic dysfunction of pelvic region: Secondary | ICD-10-CM | POA: Diagnosis not present

## 2016-04-14 DIAGNOSIS — M9903 Segmental and somatic dysfunction of lumbar region: Secondary | ICD-10-CM | POA: Diagnosis not present

## 2016-04-14 DIAGNOSIS — M9902 Segmental and somatic dysfunction of thoracic region: Secondary | ICD-10-CM | POA: Diagnosis not present

## 2016-04-14 DIAGNOSIS — M62830 Muscle spasm of back: Secondary | ICD-10-CM | POA: Diagnosis not present

## 2016-04-16 DIAGNOSIS — M9903 Segmental and somatic dysfunction of lumbar region: Secondary | ICD-10-CM | POA: Diagnosis not present

## 2016-04-16 DIAGNOSIS — M7042 Prepatellar bursitis, left knee: Secondary | ICD-10-CM | POA: Diagnosis not present

## 2016-04-16 DIAGNOSIS — M9905 Segmental and somatic dysfunction of pelvic region: Secondary | ICD-10-CM | POA: Diagnosis not present

## 2016-04-16 DIAGNOSIS — M9902 Segmental and somatic dysfunction of thoracic region: Secondary | ICD-10-CM | POA: Diagnosis not present

## 2016-04-16 DIAGNOSIS — M62830 Muscle spasm of back: Secondary | ICD-10-CM | POA: Diagnosis not present

## 2016-04-23 DIAGNOSIS — M9905 Segmental and somatic dysfunction of pelvic region: Secondary | ICD-10-CM | POA: Diagnosis not present

## 2016-04-23 DIAGNOSIS — M62830 Muscle spasm of back: Secondary | ICD-10-CM | POA: Diagnosis not present

## 2016-04-23 DIAGNOSIS — M9903 Segmental and somatic dysfunction of lumbar region: Secondary | ICD-10-CM | POA: Diagnosis not present

## 2016-04-23 DIAGNOSIS — M7042 Prepatellar bursitis, left knee: Secondary | ICD-10-CM | POA: Diagnosis not present

## 2016-04-23 DIAGNOSIS — M9902 Segmental and somatic dysfunction of thoracic region: Secondary | ICD-10-CM | POA: Diagnosis not present

## 2016-04-25 ENCOUNTER — Ambulatory Visit: Payer: Self-pay

## 2016-04-25 ENCOUNTER — Encounter: Payer: Self-pay | Admitting: Sports Medicine

## 2016-04-25 ENCOUNTER — Ambulatory Visit (INDEPENDENT_AMBULATORY_CARE_PROVIDER_SITE_OTHER): Payer: BLUE CROSS/BLUE SHIELD | Admitting: Sports Medicine

## 2016-04-25 VITALS — BP 110/76 | HR 80 | Ht 65.0 in | Wt 143.8 lb

## 2016-04-25 DIAGNOSIS — M25562 Pain in left knee: Secondary | ICD-10-CM | POA: Diagnosis not present

## 2016-04-25 DIAGNOSIS — M25462 Effusion, left knee: Secondary | ICD-10-CM | POA: Diagnosis not present

## 2016-04-25 NOTE — Assessment & Plan Note (Signed)
Injection today.  Continue with compression. Hip abduction strengthening for the left given the small amount of weakness appreciated on exam. If any lack of improvement consider further diagnosis evaluation with MRI

## 2016-04-25 NOTE — Progress Notes (Signed)
OFFICE VISIT NOTE Kirsten Coleman. Rigby, Kirsten Coleman at [place]  Kirsten Coleman - 41 y.o. female MRN [id number]  Date of birth: 12/01/1975  Visit Date: 04/25/2016  PCP: Gara Kroner, MD   Referred by: Antony Contras, MD  SUBJECTIVE:   Chief Complaint  Patient presents with  . pain in left knee    left knee pain x 3 weeks. She has been seen by chiropractor and was advised to see Dr. Paulla Fore. Chiropractor had concerns of a bakers cyst. Pt is a runner. After training she had trouble going up and down stairs. The left knee was swollen and very painful. The swelling has gone down. She has catching in the knee when walking. When knee catches pain is 9/10. The pain comes and goes. The knee tends to be sore every night. She can't squat past 90 degrees without pain. She iced with some relief.    HPI: As above. Additional pertinent information includes:  Above history reviewed myself. Additional pertinent history includes: Generalized left knee pain. 3 weeks ago following a 15 minute run after 2 and half hour bike.  No eliciting injury.  The pain is described as tightness and stiffness and is rated as improved but previously severe.  Worsened with walking, activity. Improves with rest and wearing compression sleeve Therapies tried include chiropractic with Dr. Daron Offer  Other associated symptoms include: none  Otherwise ROS as it pertains to the is as below:  She denies any numbness, tingling, lower extremity weakness.  No prior right or left knee injuries.   ROS: ROS  Otherwise per HPI.  HISTORY & PERTINENT PRIOR DATA:  No specialty comments available. She reports that she has never smoked. She has never used smokeless tobacco. No results for input(s): HGBA1C, LABURIC in the last 8760 hours. Medications & Allergies reviewed per EMR Patient Active Problem List   Diagnosis Date Noted  . Acute pain of left knee 04/25/2016  .  Lightheadedness 10/01/2012   Past Medical History:  Diagnosis Date  . Light headedness   . Migraine    Family History  Problem Relation Age of Onset  . Alzheimer's disease      grandfather  . Hypertension Father   . Lung cancer      uncle  . Rheum arthritis      grandmother   Past Surgical History:  Procedure Laterality Date  . APPENDECTOMY    . DILITATION & CURRETTAGE/HYSTROSCOPY WITH VERSAPOINT RESECTION N/A 10/31/2014   Procedure: DILATATION & CURETTAGE/HYSTEROSCOPY WITH VERSAPOINT RESECTION;  Surgeon: Princess Bruins, MD;  Location: [facility];  Service: Gynecology;  Laterality: N/A;  . MOUTH SURGERY    . NOSE SURGERY    . TONSILLECTOMY     Social History   Occupational History  .  [employer]    [employer]   Social History Main Topics  . Smoking status: Never Smoker  . Smokeless tobacco: Never Used  . Alcohol use 0.6 oz/week    1 Glasses of wine per week     Comment: social   . Drug use: No  . Sexual activity: Not on file    OBJECTIVE:  VS:  HT:5\' 5"  (165.1 cm)   WT:143 lb 12.8 oz (65.2 kg)  BMI:24    BP:110/76  HR:80bpm  TEMP: ( )  RESP:99 % Physical Exam  Constitutional: She appears well-developed and well-nourished. She is cooperative.  Non-toxic appearance.  HENT:  Head: Normocephalic and atraumatic.  Cardiovascular: Intact distal  pulses.   Pulmonary/Chest: No accessory muscle usage. No respiratory distress.  Neurological: She is alert. She is not disoriented. She displays normal reflexes. No sensory deficit.  Skin: Skin is warm, dry and intact. Capillary refill takes less than 2 seconds. No abrasion and no rash noted.  Psychiatric: She has a normal mood and affect. Her speech is normal and behavior is normal. Thought content normal.   LEFT Knee:  No significant rashes/lesions/ulcerations overlying the legs.  No significant bruising or scarring  No significant pretibial edema.  No clubbing or cyanosis.  DP & PT pulses 2+/4.  LE Sensation  intact to light touch.  Overall joint is well aligned, no significant deformity.    Small effusion.    ROM: 0 to 120.    Extensor mechanism intact  No significant medial or lateral joint line tenderness.    Stable to varus/valgus strain & anterior/posterior drawer.  Normal Lachman's.    Negative McMurray's and Thessaly.    Hip abduction strength is 4/5 in the left compared to 5/5 on the right   IMAGING & PROCEDURES: No results found. No additional findings.   PROCEDURE NOTE: ULTRASOUND GUIDED LEFT KNEEINJECTION  Images were obtained and interpreted by myself, Teresa Coombs, DO  Images have been saved and stored to PACS system. Images obtained on: GE S7 Ultrasound machine  Findings: Small effusion.  Intact patellar tendon.  Medial meniscus is degenerative with bulging and a split in the anterior portion.  DESCRIPTION OF PROCEDURE:  The patient's clinical condition is marked by substantial pain and/or significant functional disability. Other conservative therapy has not provided relief, is contraindicated, or not appropriate. There is a reasonable likelihood that injection will significantly improve the patient's pain and/or functional impairment. After discussing the risks, benefits and expected outcomes of the injection and all questions were reviewed and answered, the patient wished to undergo the above named procedure. Verbal consent was obtained. The ultrasound was used to identify the target structure and adjacent neurovascular structures. The skin was then prepped in sterile fashion and the target structure was injected under direct visualization using sterile technique as below: PREP: Alcohol, Ethel Chloride APPROACH: Superior lateral, 25g 1.5" needle INJECTATE: 2 cc 0.5% marcaine, 1 cc 40mg  DepoMedrol ASPIRATE: N/A DRESSING: Band-Aid  Post procedural instructions including recommending icing and warning signs for infection were reviewed. This procedure was well  tolerated and there were no complications.   IMPRESSION: Succesful US Guided Injection   ASSESSMENT & PLAN:  Visit Diagnoses:  1. Acute pain of left knee   2. Effusion of left knee    Meds: No orders of the defined types were placed in this encounter.   Orders:  Orders Placed This Encounter  Procedures  . [place] LIMITED JOINT SPACE STRUCTURES LOW LEFT(NO LINKED CHARGES)    Follow-up: Return if symptoms worsen or fail to improve.   Otherwise please see problem oriented charting as below.

## 2016-05-08 ENCOUNTER — Encounter: Payer: Self-pay | Admitting: Obstetrics & Gynecology

## 2016-05-08 ENCOUNTER — Ambulatory Visit (INDEPENDENT_AMBULATORY_CARE_PROVIDER_SITE_OTHER): Payer: BLUE CROSS/BLUE SHIELD | Admitting: Obstetrics & Gynecology

## 2016-05-08 VITALS — BP 124/72 | Ht 64.75 in | Wt 144.0 lb

## 2016-05-08 DIAGNOSIS — N943 Premenstrual tension syndrome: Secondary | ICD-10-CM

## 2016-05-08 DIAGNOSIS — Z01419 Encounter for gynecological examination (general) (routine) without abnormal findings: Secondary | ICD-10-CM

## 2016-05-08 LAB — URINALYSIS W MICROSCOPIC + REFLEX CULTURE
Bacteria, UA: NONE SEEN [HPF]
Bilirubin Urine: NEGATIVE
CASTS: NONE SEEN [LPF]
Crystals: NONE SEEN [HPF]
Glucose, UA: NEGATIVE
HGB URINE DIPSTICK: NEGATIVE
Ketones, ur: NEGATIVE
Leukocytes, UA: NEGATIVE
NITRITE: NEGATIVE
PH: 6 (ref 5.0–8.0)
Protein, ur: NEGATIVE
RBC / HPF: NONE SEEN RBC/HPF (ref <=2)
SPECIFIC GRAVITY, URINE: 1.015 (ref 1.001–1.035)
WBC, UA: NONE SEEN WBC/HPF (ref <=5)
YEAST: NONE SEEN [HPF]

## 2016-05-08 MED ORDER — NORETHINDRONE 0.35 MG PO TABS: 1.0000 | ORAL_TABLET | Freq: Every day | ORAL | 4 refills | Status: DC

## 2016-05-08 NOTE — Addendum Note (Signed)
Addended by: Thurnell Garbe A on: 05/08/2016 12:23 PM   Modules accepted: Orders

## 2016-05-08 NOTE — Progress Notes (Signed)
Kirsten Coleman [birth year] [id number]   History:    41 y.o. G3P2A1 married.  2 healthy children.  Vasectomy.  Established patient presenting for annual gyn exam.    C/O worsening PMS with low mood x 1 week before period every month.  Menses reg normal qmonth otherwise.  No pelvic pain.  Breasts wnl.  Recent treated cystitis with very mild Sxs, would like a U/A today.  Past medical history,surgical history, family history and social history were all reviewed and documented in the EPIC chart.  Gynecologic History Patient's last menstrual period was 05/04/2016. Contraception: vasectomy Last Pap: 2016. Results were: normal with HR HPV neg Last mammogram: 2018. Results were: normal  Obstetric History OB History  Gravida Para Term Preterm AB Living  3 2     1 2   SAB TAB Ectopic Multiple Live Births  1            # Outcome Date GA Lbr Len/2nd Weight Sex Delivery Anes PTL Lv  3 SAB           2 Para           1 Para                ROS: A ROS was performed and pertinent positives and negatives are included in the history.  GENERAL: No fevers or chills. HEENT: No change in vision, no earache, sore throat or sinus congestion. NECK: No pain or stiffness. CARDIOVASCULAR: No chest pain or pressure. No palpitations. PULMONARY: No shortness of breath, cough or wheeze. GASTROINTESTINAL: No abdominal pain, nausea, vomiting or diarrhea, melena or bright red blood per rectum. GENITOURINARY: No urinary frequency, urgency, hesitancy or dysuria. MUSCULOSKELETAL: No joint or muscle pain, no back pain, no recent trauma. DERMATOLOGIC: No rash, no itching, no lesions. ENDOCRINE: No polyuria, polydipsia, no heat or cold intolerance. No recent change in weight. HEMATOLOGICAL: No anemia or easy bruising or bleeding. NEUROLOGIC: No headache, seizures, numbness, tingling or weakness. PSYCHIATRIC: No depression, no loss of interest in normal activity or change in sleep pattern.     Exam:   BP 124/72   Ht 5'  4.75" (1.645 m)   Wt 144 lb (65.3 kg)   LMP 05/04/2016   BMI 24.15 kg/m   Body mass index is 24.15 kg/m.  General appearance : Well developed well nourished female. No acute distress HEENT: Eyes: no retinal hemorrhage or exudates,  Neck supple, trachea midline, no carotid bruits, no thyroidmegaly Lungs: Clear to auscultation, no rhonchi or wheezes, or rib retractions  Heart: Regular rate and rhythm, no murmurs or gallops Breast:Examined in sitting and supine position were symmetrical in appearance, no palpable masses or tenderness,  no skin retraction, no nipple inversion, no nipple discharge, no skin discoloration, no axillary or supraclavicular lymphadenopathy Abdomen: no palpable masses or tenderness, no rebound or guarding Extremities: no edema or skin discoloration or tenderness  Pelvic:  Bartholin, Urethra, Skene Glands: Within normal limits             Vagina: No gross lesions or discharge  Cervix: No gross lesions or discharge.  Pap reflex done.  Uterus  RV, normal size, shape and consistency, non-tender and mobile  Adnexa  Without masses or tenderness  Anus and perineum  normal     Assessment/Plan:  41 y.o. female for annual exam.  1. Encounter for routine gynecological examination with Papanicolaou smear of cervix Normal Aex/Gyn exam.  Pap pending.  Mammo neg 2018.  2. Premenstrual syndrome Low moods  x 1 wk before each period.  Counseling done on cause and treatment approaches including Primrose Oil, Anti-Depressants and BCPs to block ovulation.  Decision to try a Progestin-Only pill.  Ortho-Micronor prescribed.  Usage/risks/benefits reviewed.  Counseling >50% x 10 min.  Princess Bruins MD, 8:36 AM 05/08/2016

## 2016-05-08 NOTE — Patient Instructions (Addendum)
Your Annueal/Gyn exam was normal.  A Pap reflex was done and I'll give you the result as soon as available.  We discussed your PMS.  Counseling was done and we decided to try a Progestin-only pill to treat it.  Your Urine analysis is pending, we'll call you with the result.  Au plaisir de te revoir l'an prochain!

## 2016-05-08 NOTE — Addendum Note (Signed)
Addended by: Thurnell Garbe A on: 05/08/2016 09:31 AM   Modules accepted: Orders

## 2016-05-09 LAB — PAP IG W/ RFLX HPV ASCU

## 2016-05-10 ENCOUNTER — Encounter: Payer: Self-pay | Admitting: Obstetrics & Gynecology

## 2016-05-21 ENCOUNTER — Encounter: Payer: Self-pay | Admitting: Sports Medicine

## 2016-05-21 ENCOUNTER — Ambulatory Visit (INDEPENDENT_AMBULATORY_CARE_PROVIDER_SITE_OTHER): Payer: BLUE CROSS/BLUE SHIELD | Admitting: Sports Medicine

## 2016-05-21 DIAGNOSIS — M25562 Pain in left knee: Secondary | ICD-10-CM | POA: Diagnosis not present

## 2016-05-21 NOTE — Progress Notes (Signed)
OFFICE VISIT NOTE Kirsten Coleman. Zo [place], [place] [facility] [place]  Kirsten Coleman - 41 y.o. female MRN [id number]  Date of birth: April 09, 1975  Visit Date: 05/21/2016  PCP: Gara Kroner, MD   Referred by: Antony Contras, MD  Jari Sportsman, cma acting as scribe for Dr. Paulla Fore.  SUBJECTIVE:   Chief Complaint  Patient presents with  . Follow-up LT knee pain   HPI: As below and per problem based documentation when appropriate.  Kirsten Coleman presents today to follow up on her LT knee pain. After receiving steroid injection on 04-25-2016 the swelling decreased. Approximately 5 days after injection there was no pain. This past weekend she started having recurrent pain and swelling noticing pain when walking up stairs. Pt has altered her exercise routine by not running but is walking daily. Has applied ice daily with some relief. Describes the pain a constant dull ache with occasionally sharp pains. No medication use for sx. She has been intermittently performing Hip exercises with some relief. Wears compression stocking daily. She is interested in an MRI for further investigation.    Review of Systems  Constitutional: Negative.   HENT: Negative.   Eyes: Negative.   Respiratory: Negative.   Cardiovascular: Negative.   Gastrointestinal: Negative.   Genitourinary: Negative.   Musculoskeletal: Positive for joint pain.  Skin: Negative.   Neurological: Negative.   Endo/Heme/Allergies: Negative.   Psychiatric/Behavioral: Negative.     Otherwise per HPI.  HISTORY & PERTINENT PRIOR DATA:  No specialty comments available. She reports that she has never smoked. She has never used smokeless tobacco. No results for input(s): HGBA1C, LABURIC in the last 8760 hours. Medications & Allergies reviewed per EMR Patient Active Problem List   Diagnosis Date Noted  . Acute pain of left knee 04/25/2016  . Lightheadedness 10/01/2012   Past Medical History:    Diagnosis Date  . Light headedness   . Migraine    Family History  Problem Relation Age of Onset  . Breast cancer Mother     lung   . Cancer Mother   . Alzheimer's disease      grandfather  . Lung cancer      uncle  . Rheum arthritis      grandmother  . Breast cancer Maternal Aunt   . Breast cancer Maternal Grandmother    Past Surgical History:  Procedure Laterality Date  . APPENDECTOMY    . DILITATION & CURRETTAGE/HYSTROSCOPY WITH VERSAPOINT RESECTION N/A 10/31/2014   Procedure: DILATATION & CURETTAGE/HYSTEROSCOPY WITH VERSAPOINT RESECTION;  Surgeon: Princess Bruins, MD;  Location: [facility];  Service: Gynecology;  Laterality: N/A;  . MOUTH SURGERY    . NOSE SURGERY    . TONSILLECTOMY     Social History   Occupational History  .  [employer]    [employer]   Social History Main Topics  . Smoking status: Never Smoker  . Smokeless tobacco: Never Used  . Alcohol use 0.6 oz/week    1 Glasses of wine per week     Comment: social   . Drug use: No  . Sexual activity: Yes    Birth control/ protection: None    OBJECTIVE:  VS:  HT:5' 4.75" (164.5 cm)   WT:142 lb 6.4 oz (64.6 kg)  BMI:23.9    BP:100/80  HR:76bpm  TEMP: ( )  RESP:99 % Physical Exam  Constitutional: She appears well-developed and well-nourished. She is cooperative.  Non-toxic appearance. No distress.  HENT:  Head:  Normocephalic and atraumatic.  Cardiovascular: Intact distal pulses.   Pulmonary/Chest: No accessory muscle usage. No respiratory distress.  Musculoskeletal:       Left knee: Medial joint line and lateral joint line tenderness noted.  Neurological: She is alert. She is not disoriented. No sensory deficit.  Skin: Skin is warm, dry and intact. Capillary refill takes less than 2 seconds. No abrasion and no rash noted.  Psychiatric: She has a normal mood and affect. Her speech is normal and behavior is normal. Thought content normal.   Left Knee Exam   Tenderness  The patient is experiencing  tenderness in the medial joint line, lateral joint line.  Comments:  Slight flexion contracture at 3.  Position of these is at 30 of flexion.  She walks with an antalgic gait.  Pain with Murray's.  She is unable to perform [facility] without significant pain.  Significant effusion.     IMAGING & PROCEDURES: No results found. No additional findings.   ASSESSMENT & PLAN:  Visit Diagnoses:  1. Acute pain of left knee    Meds: No orders of the defined types were placed in this encounter.   Orders:  Orders Placed This Encounter  Procedures  . MR Knee Left  Wo Contrast    Follow-up: Return for MRI Review.  Otherwise please see problem oriented charting as below.  CMA/ATC served as Education administrator during this visit. History, Physical, and Plan performed by medical provider. Documentation and orders reviewed and attested to.      Teresa Coombs, [facility] Physician    05/21/2016 9:03 AM

## 2016-05-21 NOTE — Assessment & Plan Note (Addendum)
Concern for significant intrameniscal fragment.  Given the findings previously on [facility] ultrasound and now worsening symptoms in spite of significant limitations to her activity further diagnostic imaging indicated for consideration of surgical intervention. Plan to have her follow-up with [facility]ics if any surgical findings on MRI.  Otherwise we will plan to see her back in clinic myself 2 days after her MRI is obtained.  The samples of Pennsaid provided to the patient.  She will call if this is beneficial and would like formal prescription.

## 2016-05-21 NOTE — Patient Instructions (Signed)
Verbal order received for patient to receive sample medication. Patient teaching completed by provider.   Medication Pennsaid 2%  Lot #:[id number] Expiration: 10/2016  Qty:  2

## 2016-06-01 ENCOUNTER — Ambulatory Visit
Admission: RE | Admit: 2016-06-01 | Discharge: 2016-06-01 | Disposition: A | Discharge status: Home / Self Care | Payer: BLUE CROSS/BLUE SHIELD | Source: Ambulatory Visit | Attending: Sports Medicine | Admitting: Sports Medicine

## 2016-06-01 DIAGNOSIS — M25562 Pain in left knee: Secondary | ICD-10-CM | POA: Diagnosis not present

## 2016-06-11 ENCOUNTER — Telehealth: Payer: Self-pay | Admitting: Sports Medicine

## 2016-06-11 NOTE — Telephone Encounter (Signed)
Read your interpretation Kirsten Coleman) at bottom of MRI report. Will call and notify patient today to discuss in detail the report findings she is concerned about. Will schedule her if needed once patient has verbalized understanding.

## 2016-06-11 NOTE — Telephone Encounter (Signed)
Forwarding to Dr. Rigby.  

## 2016-06-11 NOTE — Telephone Encounter (Signed)
Called pt left VM (as per pt request) stating that appt was made for 4pm on 06/13/16. Discussed her MRI results previously during phone call earlier today. Stated should the patient have any further questions between now and Friday to call [facility] office.

## 2016-06-11 NOTE — Telephone Encounter (Signed)
Patient called in reference to MRI results. Patient said she saw the results on my chart but just wanted clarification on what it was saying and also wanted to know what the next step was. Please call patient and advise. OK to leave detailed message on phone if she does not answer.

## 2016-06-13 ENCOUNTER — Encounter: Payer: Self-pay | Admitting: Sports Medicine

## 2016-06-13 ENCOUNTER — Ambulatory Visit (INDEPENDENT_AMBULATORY_CARE_PROVIDER_SITE_OTHER): Payer: BLUE CROSS/BLUE SHIELD | Admitting: Sports Medicine

## 2016-06-13 VITALS — BP 112/80 | HR 87 | Ht 64.75 in | Wt 145.6 lb

## 2016-06-13 DIAGNOSIS — M25462 Effusion, left knee: Secondary | ICD-10-CM | POA: Diagnosis not present

## 2016-06-13 DIAGNOSIS — M25562 Pain in left knee: Secondary | ICD-10-CM

## 2016-06-13 DIAGNOSIS — M1712 Unilateral primary osteoarthritis, left knee: Secondary | ICD-10-CM | POA: Diagnosis not present

## 2016-06-13 NOTE — Patient Instructions (Signed)
Please perform the exercise program that Jeneen Rinks has prepared for you and gone over in detail on a daily basis.  In addition to the handout you were provided you can access your program through: [email or web address]   Your unique program code is: [email or web address]

## 2016-06-13 NOTE — Progress Notes (Signed)
OFFICE VISIT NOTE Juanda Bond. [place], [place] [facility] [place]  [place] Stanger - 41 y.o. female MRN [id number]  Date of birth: 1975-03-17  Visit Date: 06/13/2016  PCP: Antony Contras, MD   Referred by: Antony Contras, MD  Burlene Arnt, CMA acting as scribe for Dr. Paulla Fore.  SUBJECTIVE:   Chief Complaint  Patient presents with  . Follow-up    left knee pain   HPI: As below and per problem based documentation when appropriate.  Pt presents today in follow-up of left knee pain  The pain is described as constant dull ache with occasional sharp pain. Pt does have some swelling in the knee. Pt has not been running or biking but she did go for a swim and felt a lot of pain in her knee afterward.   Worsened with walking upstairs, trying to bend past 90 degrees Improves with icing the knee, hip exercises, compression stockings Therapies tried include : steroid injection 04/25/16, she is not interested in another injection today but is interested in visco injection. She would also like to discuss exercises to help strengthen her muscles around the knee.   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  Intact ligamentous structures and no acute bony findings.  No meniscal tears.  Small joint effusion and mild synovitis.    Review of Systems  Constitutional: Negative for chills, fever and weight loss.  HENT: Negative.   Eyes: Negative.   Respiratory: Negative for shortness of breath and wheezing.   Cardiovascular: Negative for chest pain and palpitations.  Gastrointestinal: Negative.   Genitourinary: Negative.   Musculoskeletal: Positive for joint pain. Negative for falls.  Skin: Negative.   Neurological: Negative for dizziness, tingling and  headaches.  Endo/Heme/Allergies: Does not bruise/bleed easily.    Otherwise per HPI.  HISTORY & PERTINENT PRIOR DATA:  No specialty comments available. She reports that she has never smoked. She has never used smokeless tobacco. No results for input(s): HGBA1C, LABURIC in the last 8760 hours. Medications & Allergies reviewed per EMR Patient Active Problem List   Diagnosis Date Noted  . Primary osteoarthritis of left knee 06/13/2016  . Acute pain of left knee 04/25/2016  . Lightheadedness 10/01/2012   Past Medical History:  Diagnosis Date  . Light headedness   . Migraine    Family History  Problem Relation Age of Onset  . Breast cancer Mother        lung   . Cancer Mother   . Alzheimer's disease Unknown        grandfather  . Lung cancer Unknown        uncle  . Rheum arthritis Unknown        grandmother  . Breast cancer Maternal Aunt   . Breast cancer Maternal Grandmother    Past Surgical History:  Procedure Laterality Date  . APPENDECTOMY    . DILITATION & CURRETTAGE/HYSTROSCOPY WITH VERSAPOINT RESECTION N/A 10/31/2014   Procedure: DILATATION & CURETTAGE/HYSTEROSCOPY WITH VERSAPOINT RESECTION;  Surgeon: Princess Bruins, MD;  Location: [facility];  Service: Gynecology;  Laterality: N/A;  . MOUTH SURGERY    . NOSE SURGERY    . TONSILLECTOMY     Social History   Occupational History  .  [employer]   Social History Main Topics  . Smoking status: Never Smoker  .  Smokeless tobacco: Never Used  . Alcohol use 0.6 oz/week    1 Glasses of wine per week     Comment: social   . Drug use: No  . Sexual activity: Yes    Birth control/ protection: None    OBJECTIVE:  VS:  HT:5' 4.75" (164.5 cm)   WT:145 lb 9.6 oz (66 kg)  BMI:24.5    BP:112/80  HR:87bpm  TEMP: ( )  RESP:98 % EXAM: Findings:  WDWN, NAD, Non-toxic appearing Alert & appropriately interactive Not depressed or anxious appearing No increased work of breathing. Pupils are equal. EOM intact  without nystagmus No clubbing or cyanosis of the extremities appreciated No significant rashes/lesions/ulcerations overlying the examined area. DP & PT pulses 2+/4.  No significant pretibial edema. Sensation intact to light touch in lower extremities.  Left Knee: Overall joint is well aligned, no significant deformity.   Small effusion today..   ROM: 0 to 120.   Extensor mechanism intact No significant medial or lateral joint line tenderness.  Some pain with patellar grind. Stable to varus/valgus strain & anterior/posterior drawer.  .   Slight pain with McMurray's, most pain with Thessaly.        Mr Knee Left  Wo Contrast  Result Date: 06/01/2016 CLINICAL DATA:  Left knee pain while going up and down stairs. EXAM: MRI OF THE LEFT KNEE WITHOUT CONTRAST TECHNIQUE: Multiplanar, multisequence MR imaging of the knee was performed. No intravenous contrast was administered. COMPARISON:  None. FINDINGS: Cough and MENISCI Medial meniscus:  Intact. Lateral meniscus:  Intact. LIGAMENTS Cruciates:  Intact. Collaterals:  Intact. CARTILAGE Patellofemoral: Moderate degenerative chondrosis/ chondromalacia involving the patellar cartilage and femoral trochlear cartilage. No definite full-thickness cartilage defects or osteochondral lesion. Mild subchondral cystic change and edema involving the medial aspect of the femoral trochlea. Medial: Mild degenerative chondrosis mainly involving the medial femoral condyle articular cartilage. Lateral:  Normal Joint: Small joint effusion and mild synovitis. Superior and medial patellar plica are noted. Popliteal Fossa:  No popliteal mass or Baker's cyst. Extensor Mechanism: The patella retinacular structures are intact and the quadriceps and patellar tendons are intact. Bones:  No acute bony findings. Other: Normal knee musculature. 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 Intact ligamentous structures and no acute bony findings. No meniscal tears. Small joint effusion and mild synovitis. Electronically Signed   By: Marijo Sanes M.D.   On: 06/01/2016 16:17   ASSESSMENT & PLAN:   Problem List Items Addressed This Visit    Acute pain of left knee - Primary    Given the findings on the MRI this does seem more consistent with patellofemoral wear.  Worsening changes.  There does appear to be a slight extrusion of the meniscus on the MRI without overt tearing.  Given the small amount of effusion with mild synovitis we did discuss the merits of using single injection Visco supplementation versus serial injections.  She will consider which when she wishes we will try to obtain approval for Synvisc series as well as SynviscOne.  She did report that the Pennsaid broke her skin out.  >50% of this 25 minute visit spent in direct patient counseling and/or coordination of care.  Discussion was focused on education regarding the in discussing the pathoetiology and anticipated clinical course of the above condition.  Therapeutic options as well as importance of neuromuscular control emphasized.  compression recommended Therapeutic exercises working on VMO strengthening as well as hip abduction strengthening  and neuromuscular control discussed.  She will follow-up after we obtain approval for the above for a procedure only visit.   +++++++++++++++++++++++++++++++++++++++++++++++++++++++++++++++ PROCEDURE NOTE: THERAPEUTIC EXERCISES (97110) 15 minutes spent for Therapeutic exercises as stated in above notes.  This included exercises focusing on stretching, strengthening, with significant focus on eccentric aspects.   Proper technique shown and discussed handout in great detail with ATC.  All questions were discussed and answered.          Primary osteoarthritis of left knee    Other Visit Diagnoses    Effusion of left knee          Follow-up: Return if  symptoms worsen or fail to improve, for for viscosupplementation. We will call you about the cost.   CMA/ATC served as scribe during this visit. History, Physical, and Plan performed by medical provider. Documentation and orders reviewed and attested to.      Teresa Coombs, [facility] Physician

## 2016-06-17 NOTE — Assessment & Plan Note (Addendum)
Given the findings on the MRI this does seem more consistent with patellofemoral wear.  Worsening changes.  There does appear to be a slight extrusion of the meniscus on the MRI without overt tearing.  Given the small amount of effusion with mild synovitis we did discuss the merits of using single injection Visco supplementation versus serial injections.  She will consider which when she wishes we will try to obtain approval for Synvisc series as well as SynviscOne.  She did report that the Pennsaid broke her skin out.  >50% of this 25 minute visit spent in direct patient counseling and/or coordination of care.  Discussion was focused on education regarding the in discussing the pathoetiology and anticipated clinical course of the above condition.  Therapeutic options as well as importance of neuromuscular control emphasized.  compression recommended Therapeutic exercises working on VMO strengthening as well as hip abduction strengthening and neuromuscular control discussed.  She will follow-up after we obtain approval for the above for a procedure only visit.   +++++++++++++++++++++++++++++++++++++++++++++++++++++++++++++++ PROCEDURE NOTE: THERAPEUTIC EXERCISES (97110) 15 minutes spent for Therapeutic exercises as stated in above notes.  This included exercises focusing on stretching, strengthening, with significant focus on eccentric aspects.   Proper technique shown and discussed handout in great detail with ATC.  All questions were discussed and answered.

## 2016-07-09 ENCOUNTER — Telehealth: Payer: Self-pay | Admitting: Sports Medicine

## 2016-07-09 NOTE — Telephone Encounter (Signed)
Patient called to see if we had any films of any kind for her to give to her for a possible stem cell procedure at another office. I asked Theadora Rama who advised that we do have an ultrasound file and the lab has to see how large the file is if they can burn it or if it will need to go to medical records. I explained this to the patient and she is understanding and awaiting a call back for an update.

## 2016-07-09 NOTE — Telephone Encounter (Signed)
I was able to burn  the disc of her left knee ultrasound. Attempted to call patient; left voicemail informing pt she can pick the disc up at her convenience.

## 2016-07-09 NOTE — Telephone Encounter (Signed)
Noted  

## 2016-09-27 DIAGNOSIS — N390 Urinary tract infection, site not specified: Secondary | ICD-10-CM | POA: Diagnosis not present

## 2016-10-01 ENCOUNTER — Telehealth: Payer: Self-pay

## 2016-10-01 NOTE — Telephone Encounter (Signed)
Patient called in voice mail stating that she is finishing Rx for UTI today and still has some urgency. Was wondering about getting another prescription. Upon review of her chart Dr. Marguerita Merles last saw her 05/08/16 for CE and urine was fine.  I called her back and left message to call me and let me know who prescribed the Rx for UTI.

## 2016-11-17 DIAGNOSIS — N390 Urinary tract infection, site not specified: Secondary | ICD-10-CM | POA: Diagnosis not present

## 2016-11-17 DIAGNOSIS — R1030 Lower abdominal pain, unspecified: Secondary | ICD-10-CM | POA: Diagnosis not present

## 2016-11-19 ENCOUNTER — Encounter: Payer: Self-pay | Admitting: Obstetrics & Gynecology

## 2016-11-20 ENCOUNTER — Other Ambulatory Visit: Payer: Self-pay | Admitting: Obstetrics & Gynecology

## 2016-11-20 MED ORDER — FLUCONAZOLE 150 MG PO TABS: ORAL_TABLET | ORAL | 3 refills | Status: DC

## 2016-12-03 DIAGNOSIS — R200 Anesthesia of skin: Secondary | ICD-10-CM | POA: Diagnosis not present

## 2016-12-23 DIAGNOSIS — R200 Anesthesia of skin: Secondary | ICD-10-CM | POA: Diagnosis not present

## 2016-12-23 DIAGNOSIS — R202 Paresthesia of skin: Secondary | ICD-10-CM | POA: Diagnosis not present

## 2016-12-23 DIAGNOSIS — F439 Reaction to severe stress, unspecified: Secondary | ICD-10-CM | POA: Diagnosis not present

## 2016-12-26 ENCOUNTER — Other Ambulatory Visit: Payer: Self-pay | Admitting: Family Medicine

## 2016-12-26 DIAGNOSIS — R200 Anesthesia of skin: Secondary | ICD-10-CM

## 2017-01-24 ENCOUNTER — Ambulatory Visit
Admission: RE | Admit: 2017-01-24 | Discharge: 2017-01-24 | Disposition: A | Discharge status: Home / Self Care | Payer: BLUE CROSS/BLUE SHIELD | Source: Ambulatory Visit | Attending: Family Medicine | Admitting: Family Medicine

## 2017-01-24 DIAGNOSIS — R200 Anesthesia of skin: Secondary | ICD-10-CM | POA: Diagnosis not present

## 2017-01-24 MED ORDER — GADOBENATE DIMEGLUMINE 529 MG/ML IV SOLN
14.0000 mL | Freq: Once | INTRAVENOUS | Status: AC | PRN
  Administered 2017-01-24: 14 mL via INTRAVENOUS

## 2017-02-27 DIAGNOSIS — M25561 Pain in right knee: Secondary | ICD-10-CM | POA: Diagnosis not present

## 2017-04-01 DIAGNOSIS — M79671 Pain in right foot: Secondary | ICD-10-CM | POA: Diagnosis not present

## 2017-04-01 DIAGNOSIS — M25562 Pain in left knee: Secondary | ICD-10-CM | POA: Diagnosis not present

## 2017-04-01 DIAGNOSIS — M25561 Pain in right knee: Secondary | ICD-10-CM | POA: Diagnosis not present

## 2017-04-01 DIAGNOSIS — M1711 Unilateral primary osteoarthritis, right knee: Secondary | ICD-10-CM | POA: Diagnosis not present

## 2017-04-01 DIAGNOSIS — M1712 Unilateral primary osteoarthritis, left knee: Secondary | ICD-10-CM | POA: Diagnosis not present

## 2017-04-01 DIAGNOSIS — M79672 Pain in left foot: Secondary | ICD-10-CM | POA: Diagnosis not present

## 2017-04-08 DIAGNOSIS — M1711 Unilateral primary osteoarthritis, right knee: Secondary | ICD-10-CM | POA: Diagnosis not present

## 2017-04-08 DIAGNOSIS — M1712 Unilateral primary osteoarthritis, left knee: Secondary | ICD-10-CM | POA: Diagnosis not present

## 2017-04-15 DIAGNOSIS — M1711 Unilateral primary osteoarthritis, right knee: Secondary | ICD-10-CM | POA: Diagnosis not present

## 2017-04-15 DIAGNOSIS — M1712 Unilateral primary osteoarthritis, left knee: Secondary | ICD-10-CM | POA: Diagnosis not present

## 2017-06-30 DIAGNOSIS — J029 Acute pharyngitis, unspecified: Secondary | ICD-10-CM | POA: Diagnosis not present

## 2017-07-22 ENCOUNTER — Encounter: Payer: Self-pay | Admitting: Obstetrics & Gynecology

## 2017-07-22 ENCOUNTER — Ambulatory Visit: Payer: BLUE CROSS/BLUE SHIELD | Admitting: Obstetrics & Gynecology

## 2017-07-22 VITALS — BP 132/80

## 2017-07-22 DIAGNOSIS — N920 Excessive and frequent menstruation with regular cycle: Secondary | ICD-10-CM

## 2017-07-22 NOTE — Progress Notes (Signed)
    Kirsten Coleman [birth year] [id number]        42 y.o.  [id number]  Married.  [place].  RP: Polypmenorrhea  HPI: Menses every 20-28 days, lasting 7 days.  No BTB.  No pelvic pain.  Using natural product for PMS, never started the Progestin-only pill.  No pain with intercourse.  Normal vaginal secretions.   OB History  Gravida Para Term Preterm AB Living  3 2     1 2   SAB TAB Ectopic Multiple Live Births  1            # Outcome Date GA Lbr Len/2nd Weight Sex Delivery Anes PTL Lv  3 SAB           2 Para           1 Para             Past medical history,surgical history, problem list, medications, allergies, family history and social history were all reviewed and documented in the EPIC chart.   Directed ROS with pertinent positives and negatives documented in the history of present illness/assessment and plan.  Exam:  Vitals:   07/22/17 1541  BP: 132/80   General appearance:  Normal  Gynecologic exam:  Vulva normal.  Speculum:  Cervix normal, no polyp.  Vagina normal.  Normal secretions.  Bimanual exam:  Uterus AV, normal volume, NT.  No adnexal mass, NT.   Assessment/Plan:  Kirsten y.o. [id number]   1. Polymenorrhea Probable tendency for early ovulation but at times the cycle is so short that it could be dysfunctional uterine bleeding or abnormal bleeding due to an endometrial lesion.  We will therefore follow-up with a pelvic ultrasound to rule out endometrial pathology such as polyps, fibroids, endometrial hyperplasia and cancer. - US Transvaginal Non-OB; Future  F/U Annual/Gyn visit next month.  Counseling on above issues and coordination of care more than 50% for 25 minutes.  Princess Bruins MD, 3:45 PM 07/22/2017

## 2017-07-26 ENCOUNTER — Encounter: Payer: Self-pay | Admitting: Obstetrics & Gynecology

## 2017-07-26 NOTE — Patient Instructions (Signed)
1. Polymenorrhea Probable tendency for early ovulation but at times the cycle is so short that it could be dysfunctional uterine bleeding or abnormal bleeding due to an endometrial lesion.  We will therefore follow-up with a pelvic ultrasound to rule out endometrial pathology such as polyps, fibroids, endometrial hyperplasia and cancer. - US Transvaginal Non-OB; Future  F/U Annual/Gyn visit next month.  Margarita Grizzle, un plaisir de te voir aujourd'hui!

## 2017-07-28 DIAGNOSIS — M9902 Segmental and somatic dysfunction of thoracic region: Secondary | ICD-10-CM | POA: Diagnosis not present

## 2017-07-28 DIAGNOSIS — M9903 Segmental and somatic dysfunction of lumbar region: Secondary | ICD-10-CM | POA: Diagnosis not present

## 2017-07-28 DIAGNOSIS — M62830 Muscle spasm of back: Secondary | ICD-10-CM | POA: Diagnosis not present

## 2017-07-28 DIAGNOSIS — M9905 Segmental and somatic dysfunction of pelvic region: Secondary | ICD-10-CM | POA: Diagnosis not present

## 2017-08-05 ENCOUNTER — Other Ambulatory Visit: Payer: Self-pay | Admitting: Obstetrics & Gynecology

## 2017-08-05 DIAGNOSIS — Z1231 Encounter for screening mammogram for malignant neoplasm of breast: Secondary | ICD-10-CM

## 2017-08-26 ENCOUNTER — Ambulatory Visit
Admission: RE | Admit: 2017-08-26 | Discharge: 2017-08-26 | Disposition: A | Discharge status: Home / Self Care | Payer: BLUE CROSS/BLUE SHIELD | Source: Ambulatory Visit | Attending: Obstetrics & Gynecology | Admitting: Obstetrics & Gynecology

## 2017-08-26 DIAGNOSIS — Z1231 Encounter for screening mammogram for malignant neoplasm of breast: Secondary | ICD-10-CM | POA: Diagnosis not present

## 2017-08-27 ENCOUNTER — Ambulatory Visit: Payer: BLUE CROSS/BLUE SHIELD | Admitting: Obstetrics & Gynecology

## 2017-08-27 ENCOUNTER — Encounter: Payer: Self-pay | Admitting: Obstetrics & Gynecology

## 2017-08-27 VITALS — BP 130/84 | Ht 64.5 in | Wt 157.8 lb

## 2017-08-27 DIAGNOSIS — Z01419 Encounter for gynecological examination (general) (routine) without abnormal findings: Secondary | ICD-10-CM | POA: Diagnosis not present

## 2017-08-27 DIAGNOSIS — Z9189 Other specified personal risk factors, not elsewhere classified: Secondary | ICD-10-CM

## 2017-08-27 DIAGNOSIS — N920 Excessive and frequent menstruation with regular cycle: Secondary | ICD-10-CM

## 2017-08-27 NOTE — Progress Notes (Signed)
  Kirsten Coleman 11/06/1975 [id number]   History:    42 y.o. G3P2A1L2 Married.  Vasectomy.  Daughter [employer] at [employer], Son starting HS at [employer].  RP:  Established patient presenting for annual gyn exam   HPI: Menses every 20-28 days, with normal flow, but increased dysmenorrhea.  Pelvic US scheduled here for 09/08/2017.  No BTB.  Normal vaginal secretions.  No pain with IC, using lubricant.  Breasts wnl.  BMI 26.67.  Past medical history,surgical history, family history and social history were all reviewed and documented in the EPIC chart.  Gynecologic History Patient's last menstrual period was 08/21/2017 (exact date). Contraception: vasectomy Last Pap: 04/2016. Results were: Negative Last mammogram: 08/2017. Results were: Negative Bone Density: Never Colonoscopy: Never  Obstetric History OB History  Gravida Para Term Preterm AB Living  3 2     1 2  SAB TAB Ectopic Multiple Live Births  1            # Outcome Date GA Lbr Len/2nd Weight Sex Delivery Anes PTL Lv  3 SAB           2 Para           1 Para              ROS: A ROS was performed and pertinent positives and negatives are included in the history.  GENERAL: No fevers or chills. HEENT: No change in vision, no earache, sore throat or sinus congestion. NECK: No pain or stiffness. CARDIOVASCULAR: No chest pain or pressure. No palpitations. PULMONARY: No shortness of breath, cough or wheeze. GASTROINTESTINAL: No abdominal pain, nausea, vomiting or diarrhea, melena or bright red blood per rectum. GENITOURINARY: No urinary frequency, urgency, hesitancy or dysuria. MUSCULOSKELETAL: No joint or muscle pain, no back pain, no recent trauma. DERMATOLOGIC: No rash, no itching, no lesions. ENDOCRINE: No polyuria, polydipsia, no heat or cold intolerance. No recent change in weight. HEMATOLOGICAL: No anemia or easy bruising or bleeding. NEUROLOGIC: No headache, seizures, numbness, tingling or weakness. PSYCHIATRIC: No  depression, no loss of interest in normal activity or change in sleep pattern.     Exam:   BP 130/84   Ht 5' 4.5" (1.638 m)   Wt 157 lb 12.8 oz (71.6 kg)   LMP 08/21/2017 (Exact Date)   BMI 26.67 kg/m   Body mass index is 26.67 kg/m.  General appearance : Well developed well nourished female. No acute distress HEENT: Eyes: no retinal hemorrhage or exudates,  Neck supple, trachea midline, no carotid bruits, no thyroidmegaly Lungs: Clear to auscultation, no rhonchi or wheezes, or rib retractions  Heart: Regular rate and rhythm, no murmurs or gallops Breast:Examined in sitting and supine position were symmetrical in appearance, no palpable masses or tenderness,  no skin retraction, no nipple inversion, no nipple discharge, no skin discoloration, no axillary or supraclavicular lymphadenopathy Abdomen: no palpable masses or tenderness, no rebound or guarding Extremities: no edema or skin discoloration or tenderness  Pelvic: Vulva: Normal             Vagina: No gross lesions or discharge  Cervix: No gross lesions or discharge  Uterus  AV, normal size, shape and consistency, non-tender and mobile  Adnexa  Without masses or tenderness  Anus: Normal   Assessment/Plan:  42 y.o. female for annual exam   1. Well female exam with routine gynecological exam Normal gynecologic exam.  Pap test negative in April 2018.  Will repeat Pap test at 2 to 3   years.  Breast exam normal.  Screening mammogram was negative August 2019.  Will follow up here for fasting health labs. - CBC; Future - Comp Met (CMET); Future - TSH; Future - Lipid panel; Future - VITAMIN D 25 Hydroxy (Vit-D Deficiency, Fractures); Future  2. Relies on partner vasectomy for contraception  3. Polymenorrhea F/U Pelvic US 09/08/2017.  Marie-Lyne Lavoie MD, 4:29 PM 08/27/2017   

## 2017-08-28 ENCOUNTER — Encounter: Payer: Self-pay | Admitting: Obstetrics & Gynecology

## 2017-08-28 NOTE — Patient Instructions (Signed)
1. Well female exam with routine gynecological exam Normal gynecologic exam.  Pap test negative in April 2018.  Will repeat Pap test at 2 to 3 years.  Breast exam normal.  Screening mammogram was negative August 2019.  Will follow up here for fasting health labs. - CBC; Future - Comp Met (CMET); Future - TSH; Future - Lipid panel; Future - VITAMIN D 25 Hydroxy (Vit-D Deficiency, Fractures); Future  2. Relies on partner vasectomy for contraception  3. Polymenorrhea F/U Pelvic US 09/08/2017.  Margarita Grizzle, un plaisir de te voir aujourd'hui!

## 2017-09-08 ENCOUNTER — Ambulatory Visit (INDEPENDENT_AMBULATORY_CARE_PROVIDER_SITE_OTHER): Payer: BLUE CROSS/BLUE SHIELD

## 2017-09-08 ENCOUNTER — Ambulatory Visit: Payer: BLUE CROSS/BLUE SHIELD | Admitting: Obstetrics & Gynecology

## 2017-09-08 ENCOUNTER — Encounter: Payer: Self-pay | Admitting: Obstetrics & Gynecology

## 2017-09-08 ENCOUNTER — Other Ambulatory Visit: Payer: Self-pay | Admitting: Obstetrics & Gynecology

## 2017-09-08 DIAGNOSIS — N920 Excessive and frequent menstruation with regular cycle: Secondary | ICD-10-CM

## 2017-09-08 DIAGNOSIS — N945 Secondary dysmenorrhea: Secondary | ICD-10-CM

## 2017-09-08 DIAGNOSIS — R6889 Other general symptoms and signs: Secondary | ICD-10-CM

## 2017-09-08 DIAGNOSIS — N831 Corpus luteum cyst of ovary, unspecified side: Secondary | ICD-10-CM

## 2017-09-08 DIAGNOSIS — R198 Other specified symptoms and signs involving the digestive system and abdomen: Secondary | ICD-10-CM

## 2017-09-08 NOTE — Progress Notes (Signed)
    Kirsten Coleman May 09, 1975 [id number]        42 y.o.  [id number] Married.  Vasectomy.  RP: Polymenorrhea for Pelvic US  HPI: Menstrual periods every 20 to 28 days with normal flow but increased dysmenorrhea.  No breakthrough bleeding.  No pelvic pain outside the periods.   OB History  Gravida Para Term Preterm AB Living  3 2     1 2   SAB TAB Ectopic Multiple Live Births  1            # Outcome Date GA Lbr Len/2nd Weight Sex Delivery Anes PTL Lv  3 SAB           2 Para           1 Para             Past medical history,surgical history, problem list, medications, allergies, family history and social history were all reviewed and documented in the EPIC chart.   Directed ROS with pertinent positives and negatives documented in the history of present illness/assessment and plan.  Exam:  There were no vitals filed for this visit. General appearance:  Normal  Pelvic US today: T/V images.  Retroverted uterus measuring 7.57 x 5.81 x 4.44 cm.  A small calcification is present in the wall of the endometrium at the junction of the myometrium measuring 4 mm.  The endometrial lining is normal at 8.7 mm (day 18 of the cycle).  Right ovary with a thick-walled corpus luteum cyst with internal low-level echoes and color flow Doppler positive at the periphery measuring 2.4 x 2.2 x 2 cm.  Left ovary normal.  Free fluid in the posterior cul-de-sac measuring 3.7 x 1.6 x 6.4 cm.   Assessment/Plan:  42 y.o. [id number]   1. Polymenorrhea Normal endometrial line at 8.7 mm.  No evidence of intra-uterine lesion.  Pelvic ultrasound findings are benign and were reviewed with the patient.  Decision to observe her menstrual period at this time.  If worsening of polymenorrhea, will consider starting a progestin only pill.  2. Secondary dysmenorrhea Will use ibuprofen as needed.  Counseling on above issues and coordination of care more than 50% for 15 minutes.  Princess Bruins MD, 2:35 PM  09/08/2017

## 2017-09-08 NOTE — Patient Instructions (Signed)
1. Polymenorrhea Normal endometrial line at 8.7 mm.  No evidence of intra-uterine lesion.  Pelvic ultrasound findings are benign and were reviewed with the patient.  Decision to observe her menstrual period at this time.  If worsening of polymenorrhea, will consider starting a progestin only pill.  2. Secondary dysmenorrhea Will use ibuprofen as needed.  Margarita Grizzle, au plaisir de te revoir!

## 2017-09-28 ENCOUNTER — Encounter: Payer: Self-pay | Admitting: Gynecology

## 2017-09-28 ENCOUNTER — Ambulatory Visit: Payer: BLUE CROSS/BLUE SHIELD | Admitting: Gynecology

## 2017-09-28 VITALS — BP 120/78

## 2017-09-28 DIAGNOSIS — N764 Abscess of vulva: Secondary | ICD-10-CM | POA: Diagnosis not present

## 2017-09-28 NOTE — Progress Notes (Signed)
    Kirsten Coleman [birth year] [id number]        42 y.o.  [id number] presents having noticed a small ingrown hair left labia majora several weeks ago.  This has progressively enlarged and become painful.  No fever or chills.  History of similar episode once before that spontaneously drained.  Past medical history,surgical history, problem list, medications, allergies, family history and social history were all reviewed and documented in the EPIC chart.  Directed ROS with pertinent positives and negatives documented in the history of present illness/assessment and plan.  Exam: Copywriter, advertising Vitals:   09/28/17 1102  BP: 120/78   General appearance:  Normal Abdomen soft nontender without masses guarding rebound Pelvic external BUS vagina with left mid to upper labia majora abscess 2+ centimeters across fluctuant.  Without significant surrounding cellulitis.  No inguinal adenopathy.  Vagina otherwise normal.  Cervix normal.  Uterus normal size midline mobile nontender.  Adnexa without masses or tenderness.  Physical Exam  Genitourinary:      Procedure: The skin overlying and surrounding the abscess was cleansed with Betadine.  The skin over the most fluctuant pointing area was infiltrated using 1% lidocaine and subsequently a stab scalpel incision was made with frank purulent return.  Some sebaceous appearing material also.  Initially drained the contents with pressure and subsequently with small forceps explored the cavity to break up any loculations.  Scant bleeding afterwards.  Patient tolerated well.  Assessment/Plan:  42 y.o. [id number] with left vulvar abscess.  Simple I&D performed.  We discussed whether this was the ingrown hair type boil that led to abscess formation versus infected sebaceous cyst.  Postop instructions given to include warm soaks several times daily.  ASAP call precautions reviewed.  Follow-up if any issues.    Anastasio Auerbach MD, 11:20 AM 09/28/2017

## 2017-09-28 NOTE — Patient Instructions (Signed)
Continue with warm soaks to the area several times daily.  Follow-up if there are any issues with the healing.

## 2017-09-29 ENCOUNTER — Ambulatory Visit: Payer: BLUE CROSS/BLUE SHIELD | Admitting: Gynecology

## 2017-12-16 DIAGNOSIS — M1712 Unilateral primary osteoarthritis, left knee: Secondary | ICD-10-CM | POA: Diagnosis not present

## 2017-12-16 DIAGNOSIS — M1711 Unilateral primary osteoarthritis, right knee: Secondary | ICD-10-CM | POA: Diagnosis not present

## 2017-12-24 DIAGNOSIS — M1711 Unilateral primary osteoarthritis, right knee: Secondary | ICD-10-CM | POA: Diagnosis not present

## 2017-12-24 DIAGNOSIS — M1712 Unilateral primary osteoarthritis, left knee: Secondary | ICD-10-CM | POA: Diagnosis not present

## 2017-12-31 DIAGNOSIS — M1712 Unilateral primary osteoarthritis, left knee: Secondary | ICD-10-CM | POA: Diagnosis not present

## 2017-12-31 DIAGNOSIS — M1711 Unilateral primary osteoarthritis, right knee: Secondary | ICD-10-CM | POA: Diagnosis not present

## 2018-01-26 DIAGNOSIS — M9903 Segmental and somatic dysfunction of lumbar region: Secondary | ICD-10-CM | POA: Diagnosis not present

## 2018-01-26 DIAGNOSIS — M9902 Segmental and somatic dysfunction of thoracic region: Secondary | ICD-10-CM | POA: Diagnosis not present

## 2018-01-26 DIAGNOSIS — M62830 Muscle spasm of back: Secondary | ICD-10-CM | POA: Diagnosis not present

## 2018-01-26 DIAGNOSIS — M9905 Segmental and somatic dysfunction of pelvic region: Secondary | ICD-10-CM | POA: Diagnosis not present

## 2018-05-14 DIAGNOSIS — R51 Headache: Secondary | ICD-10-CM | POA: Diagnosis not present

## 2018-05-14 DIAGNOSIS — R5383 Other fatigue: Secondary | ICD-10-CM | POA: Diagnosis not present

## 2018-06-03 DIAGNOSIS — M940 Chondrocostal junction syndrome [Tietze]: Secondary | ICD-10-CM | POA: Diagnosis not present

## 2018-06-03 DIAGNOSIS — B373 Candidiasis of vulva and vagina: Secondary | ICD-10-CM | POA: Diagnosis not present

## 2018-06-04 DIAGNOSIS — M9902 Segmental and somatic dysfunction of thoracic region: Secondary | ICD-10-CM | POA: Diagnosis not present

## 2018-06-04 DIAGNOSIS — M9905 Segmental and somatic dysfunction of pelvic region: Secondary | ICD-10-CM | POA: Diagnosis not present

## 2018-06-04 DIAGNOSIS — M9903 Segmental and somatic dysfunction of lumbar region: Secondary | ICD-10-CM | POA: Diagnosis not present

## 2018-06-04 DIAGNOSIS — M62830 Muscle spasm of back: Secondary | ICD-10-CM | POA: Diagnosis not present

## 2018-06-30 DIAGNOSIS — Z20828 Contact with and (suspected) exposure to other viral communicable diseases: Secondary | ICD-10-CM | POA: Diagnosis not present

## 2018-07-15 DIAGNOSIS — Z20828 Contact with and (suspected) exposure to other viral communicable diseases: Secondary | ICD-10-CM | POA: Diagnosis not present

## 2018-08-24 DIAGNOSIS — M1712 Unilateral primary osteoarthritis, left knee: Secondary | ICD-10-CM | POA: Diagnosis not present

## 2018-08-24 DIAGNOSIS — M1711 Unilateral primary osteoarthritis, right knee: Secondary | ICD-10-CM | POA: Diagnosis not present

## 2018-08-30 ENCOUNTER — Other Ambulatory Visit: Payer: Self-pay | Admitting: Obstetrics & Gynecology

## 2018-08-30 DIAGNOSIS — Z1231 Encounter for screening mammogram for malignant neoplasm of breast: Secondary | ICD-10-CM

## 2018-08-31 ENCOUNTER — Other Ambulatory Visit: Payer: Self-pay

## 2018-09-01 ENCOUNTER — Encounter: Payer: Self-pay | Admitting: Obstetrics & Gynecology

## 2018-09-01 ENCOUNTER — Ambulatory Visit (INDEPENDENT_AMBULATORY_CARE_PROVIDER_SITE_OTHER): Payer: BC Managed Care – PPO | Admitting: Obstetrics & Gynecology

## 2018-09-01 ENCOUNTER — Other Ambulatory Visit: Payer: Self-pay

## 2018-09-01 VITALS — BP 126/80 | Ht 64.0 in | Wt 164.0 lb

## 2018-09-01 DIAGNOSIS — N644 Mastodynia: Secondary | ICD-10-CM

## 2018-09-01 DIAGNOSIS — N945 Secondary dysmenorrhea: Secondary | ICD-10-CM

## 2018-09-01 DIAGNOSIS — Z9189 Other specified personal risk factors, not elsewhere classified: Secondary | ICD-10-CM

## 2018-09-01 DIAGNOSIS — Z01419 Encounter for gynecological examination (general) (routine) without abnormal findings: Secondary | ICD-10-CM

## 2018-09-01 MED ORDER — NORETHINDRONE 0.35 MG PO TABS: 1.0000 | ORAL_TABLET | Freq: Every day | ORAL | 4 refills | Status: DC

## 2018-09-01 MED ORDER — NORETHINDRONE 0.35 MG PO TABS: 1.0000 | ORAL_TABLET | Freq: Every day | ORAL | 11 refills | Status: DC

## 2018-09-01 NOTE — Progress Notes (Signed)
Kirsten Coleman 10-22-1975 [id number]   History:    43 y.o. G3P2A1L2 Married. Vasectomy.  Nurse, supervisor at [employer].  RP:  Established patient presenting for annual gyn exam   HPI:  Menses every 20 to 32 days, normal flow but more cramping, starting before the menses.  No BTB.  No pelvic pain otherwise.  Occasional cramping with IC.  Normal vaginal secretions.  C/O Rt breast pain which started recently, not cyclic.  Stopped coffee, but pain persists.  No lump felt.  BMI 28.15.  Bought a row boat...  Health labs with Chiropractor.  Got injections in both knees recently.  Past medical history,surgical history, family history and social history were all reviewed and documented in the EPIC chart.  Gynecologic History No LMP recorded. Contraception: vasectomy Last Pap: 04/2016. Results were: Negative Last mammogram: 08/2017. Results were: Negative Bone Density: Never Colonoscopy: Never  Obstetric History OB History  Gravida Para Term Preterm AB Living  3 2     1 2   SAB TAB Ectopic Multiple Live Births  1            # Outcome Date GA Lbr Len/2nd Weight Sex Delivery Anes PTL Lv  3 SAB           2 Para           1 Para              ROS: A ROS was performed and pertinent positives and negatives are included in the history.  GENERAL: No fevers or chills. HEENT: No change in vision, no earache, sore throat or sinus congestion. NECK: No pain or stiffness. CARDIOVASCULAR: No chest pain or pressure. No palpitations. PULMONARY: No shortness of breath, cough or wheeze. GASTROINTESTINAL: No abdominal pain, nausea, vomiting or diarrhea, melena or bright red blood per rectum. GENITOURINARY: No urinary frequency, urgency, hesitancy or dysuria. MUSCULOSKELETAL: No joint or muscle pain, no back pain, no recent trauma. DERMATOLOGIC: No rash, no itching, no lesions. ENDOCRINE: No polyuria, polydipsia, no heat or cold intolerance. No recent change in weight. HEMATOLOGICAL: No anemia or easy  bruising or bleeding. NEUROLOGIC: No headache, seizures, numbness, tingling or weakness. PSYCHIATRIC: No depression, no loss of interest in normal activity or change in sleep pattern.     Exam:   BP 126/80 (BP Location: Right Arm, Patient Position: Sitting, Cuff Size: Normal)   Ht 5\' 4"  (1.626 m)   Wt 164 lb (74.4 kg)   BMI 28.15 kg/m   Body mass index is 28.15 kg/m.  General appearance : Well developed well nourished female. No acute distress HEENT: Eyes: no retinal hemorrhage or exudates,  Neck supple, trachea midline, no carotid bruits, no thyroidmegaly Lungs: Clear to auscultation, no rhonchi or wheezes, or rib retractions  Heart: Regular rate and rhythm, no murmurs or gallops Breast:Examined in sitting and supine position were symmetrical in appearance, no palpable masses or tenderness,  no skin retraction, no nipple inversion, no nipple discharge, no skin discoloration, no axillary or supraclavicular lymphadenopathy Abdomen: no palpable masses or tenderness, no rebound or guarding Extremities: no edema or skin discoloration or tenderness  Pelvic: Vulva: Normal             Vagina: No gross lesions or discharge  Cervix: No gross lesions or discharge.  Pap reflex done.  Uterus  AV, normal size, shape and consistency, non-tender and mobile  Adnexa  Without masses or tenderness  Anus: Normal   Assessment/Plan:  43 y.o. female for annual exam  1. Encounter for routine gynecological examination with Papanicolaou smear of cervix Normal gynecologic exam.  Pap reflex done.  Breast exam normal.  Last mammogram August 2019 was negative.  Patient will schedule her screening mammogram now.  Health labs with family physician.  Body mass index 28.15.  Recommend a lower calorie/carb diet with increased aerobic activities to 5 times a week and weightlifting every 2 days.  2. Relies on partner vasectomy for contraception  3. Secondary dysmenorrhea Decision to start on the progestin only  birth control pill to control dysmenorrhea.  Usage reviewed and prescription sent to pharmacy.  4. Pain of right breast Right breast pain and tenderness with normal exam.  Patient will have a diagnostic right mammography and right breast ultrasound and a screening left mammography.  Other orders - NON FORMULARY; Bullet proof Forbose - Ashwagandha 500 MG CAPS; as directed. - Bitter Melon Extract 10 % POWD; as directed. - norethindrone (MICRONOR) 0.35 MG tablet; Take 1 tablet (0.35 mg total) by mouth daily.  Princess Bruins MD, 4:15 PM 09/01/2018

## 2018-09-02 ENCOUNTER — Telehealth: Payer: Self-pay | Admitting: *Deleted

## 2018-09-02 ENCOUNTER — Encounter: Payer: Self-pay | Admitting: *Deleted

## 2018-09-02 DIAGNOSIS — N644 Mastodynia: Secondary | ICD-10-CM

## 2018-09-02 DIAGNOSIS — Z01419 Encounter for gynecological examination (general) (routine) without abnormal findings: Secondary | ICD-10-CM | POA: Diagnosis not present

## 2018-09-02 NOTE — Telephone Encounter (Signed)
Patient scheduled at breast center 09/08/18 @ 8:20am, patient asked I send my chart with time and date.

## 2018-09-02 NOTE — Telephone Encounter (Signed)
-----   Message from Princess Bruins, MD sent at 09/01/2018  4:38 PM EDT ----- Regarding: Add Rt Dx mammo/US to already scheduled screening mammo at the [facility] Breast pain at 12 O'Clock midway above the nipple.

## 2018-09-03 LAB — PAP IG W/ RFLX HPV ASCU

## 2018-09-05 ENCOUNTER — Encounter: Payer: Self-pay | Admitting: Obstetrics & Gynecology

## 2018-09-05 NOTE — Patient Instructions (Signed)
1. Encounter for routine gynecological examination with Papanicolaou smear of cervix Normal gynecologic exam.  Pap reflex done.  Breast exam normal.  Last mammogram August 2019 was negative.  Patient will schedule her screening mammogram now.  Health labs with family physician.  Body mass index 28.15.  Recommend a lower calorie/carb diet with increased aerobic activities to 5 times a week and weightlifting every 2 days.  2. Relies on partner vasectomy for contraception  3. Secondary dysmenorrhea Decision to start on the progestin only birth control pill to control dysmenorrhea.  Usage reviewed and prescription sent to pharmacy.  4. Pain of right breast Right breast pain and tenderness with normal exam.  Patient will have a diagnostic right mammography and right breast ultrasound and a screening left mammography.  Other orders - NON FORMULARY; Bullet proof Forbose - Ashwagandha 500 MG CAPS; as directed. - Bitter Melon Extract 10 % POWD; as directed. - norethindrone (MICRONOR) 0.35 MG tablet; Take 1 tablet (0.35 mg total) by mouth daily.  Aarica, it was a pleasure seeing you today!  I will inform you of your results as soon as they are available.

## 2018-09-06 ENCOUNTER — Encounter: Payer: Self-pay | Admitting: *Deleted

## 2018-09-08 ENCOUNTER — Ambulatory Visit
Admission: RE | Admit: 2018-09-08 | Discharge: 2018-09-08 | Disposition: A | Discharge status: Home / Self Care | Payer: BC Managed Care – PPO | Source: Ambulatory Visit | Attending: Obstetrics & Gynecology | Admitting: Obstetrics & Gynecology

## 2018-09-08 ENCOUNTER — Other Ambulatory Visit: Payer: Self-pay

## 2018-09-08 DIAGNOSIS — R928 Other abnormal and inconclusive findings on diagnostic imaging of breast: Secondary | ICD-10-CM | POA: Diagnosis not present

## 2018-09-08 DIAGNOSIS — N644 Mastodynia: Secondary | ICD-10-CM | POA: Diagnosis not present

## 2018-09-08 DIAGNOSIS — R922 Inconclusive mammogram: Secondary | ICD-10-CM | POA: Diagnosis not present

## 2018-09-15 DIAGNOSIS — M25562 Pain in left knee: Secondary | ICD-10-CM | POA: Diagnosis not present

## 2018-09-15 DIAGNOSIS — M1712 Unilateral primary osteoarthritis, left knee: Secondary | ICD-10-CM | POA: Diagnosis not present

## 2018-09-15 DIAGNOSIS — M1711 Unilateral primary osteoarthritis, right knee: Secondary | ICD-10-CM | POA: Diagnosis not present

## 2018-09-15 DIAGNOSIS — M25561 Pain in right knee: Secondary | ICD-10-CM | POA: Diagnosis not present

## 2018-09-22 DIAGNOSIS — M1711 Unilateral primary osteoarthritis, right knee: Secondary | ICD-10-CM | POA: Diagnosis not present

## 2018-09-22 DIAGNOSIS — M1712 Unilateral primary osteoarthritis, left knee: Secondary | ICD-10-CM | POA: Diagnosis not present

## 2018-09-29 DIAGNOSIS — M1712 Unilateral primary osteoarthritis, left knee: Secondary | ICD-10-CM | POA: Diagnosis not present

## 2018-09-29 DIAGNOSIS — M1711 Unilateral primary osteoarthritis, right knee: Secondary | ICD-10-CM | POA: Diagnosis not present

## 2018-10-12 ENCOUNTER — Encounter: Payer: Self-pay | Admitting: Gynecology

## 2018-10-21 DIAGNOSIS — M940 Chondrocostal junction syndrome [Tietze]: Secondary | ICD-10-CM | POA: Diagnosis not present

## 2018-12-20 DIAGNOSIS — B356 Tinea cruris: Secondary | ICD-10-CM | POA: Diagnosis not present

## 2019-01-27 DIAGNOSIS — J0100 Acute maxillary sinusitis, unspecified: Secondary | ICD-10-CM | POA: Diagnosis not present

## 2019-06-02 DIAGNOSIS — Z20828 Contact with and (suspected) exposure to other viral communicable diseases: Secondary | ICD-10-CM | POA: Diagnosis not present

## 2019-07-29 IMAGING — MG DIGITAL SCREENING BILATERAL MAMMOGRAM WITH TOMO AND CAD
8 series · 8 of 24 positions shown · non-contrast
Comparison: Previous exam(s).

CLINICAL DATA: Screening.

EXAM:
DIGITAL SCREENING BILATERAL MAMMOGRAM WITH TOMO AND CAD

[R MLO synth-2D]
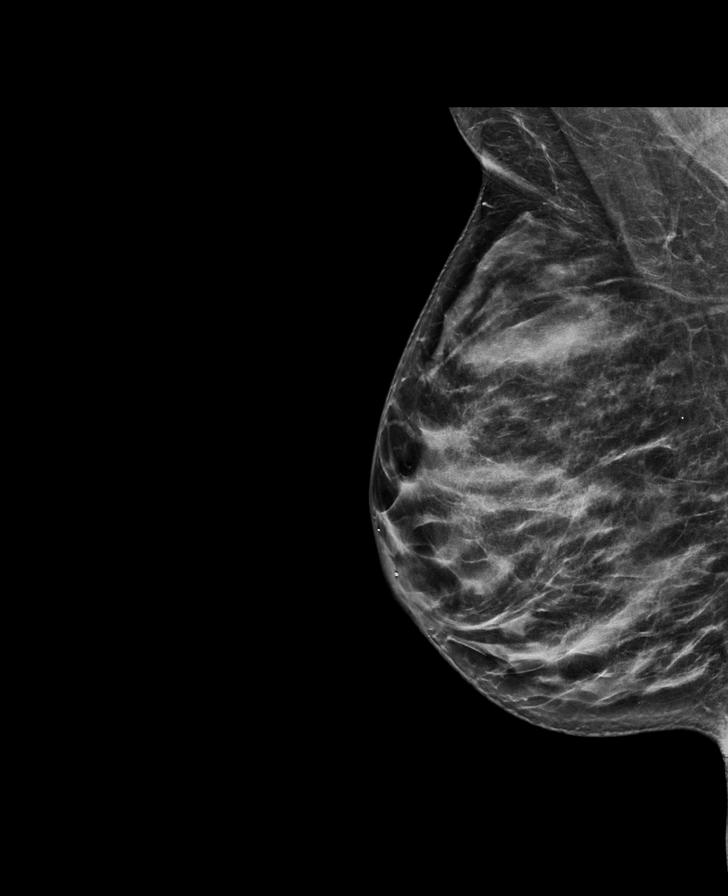

[L MLO synth-2D]
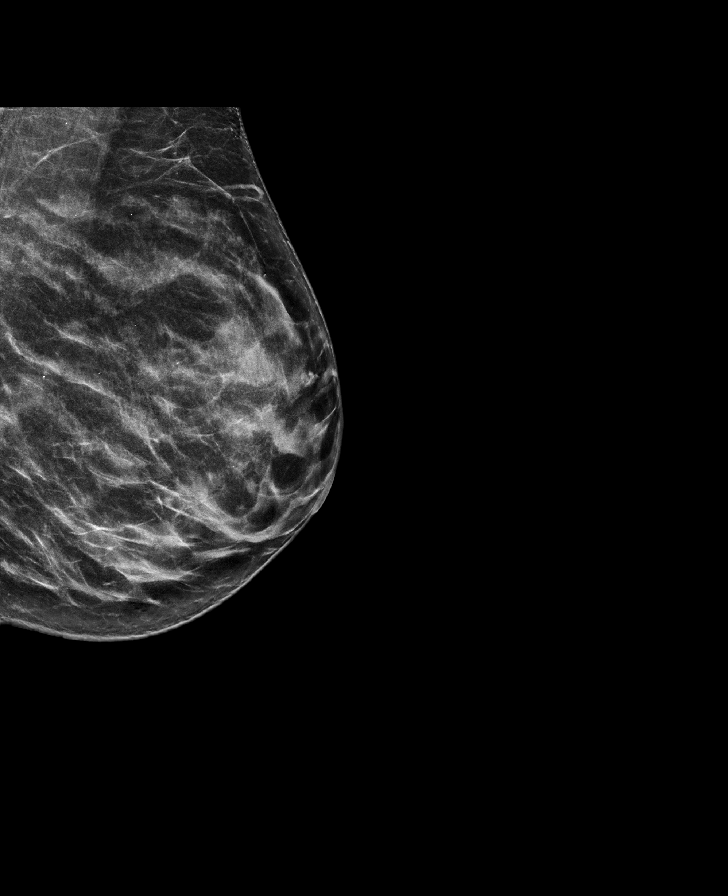

[R CC synth-2D]
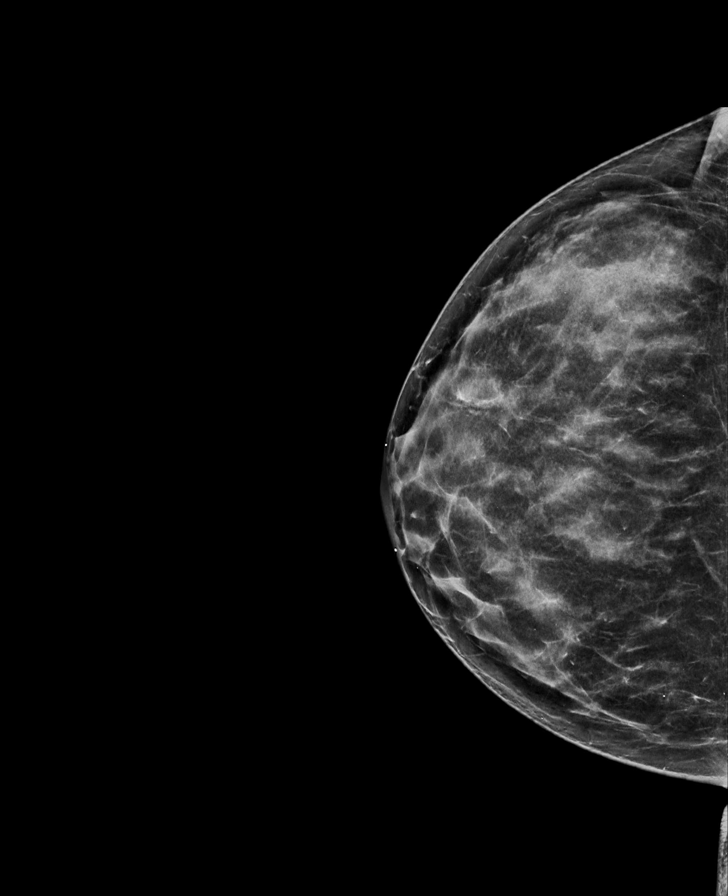

[L CC synth-2D]
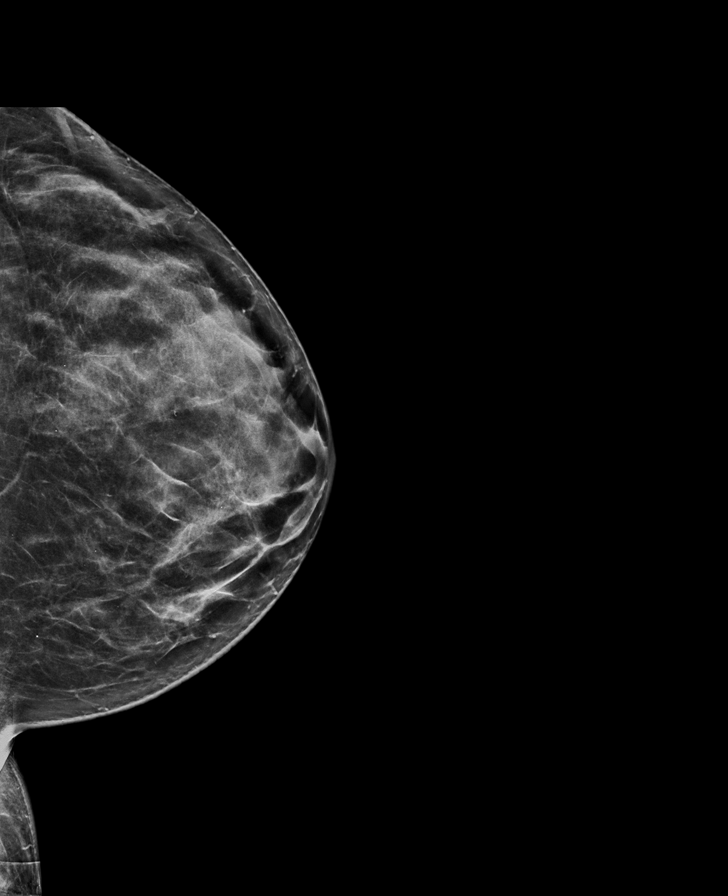

[L CC tomo · tomo slice 35/68.0]
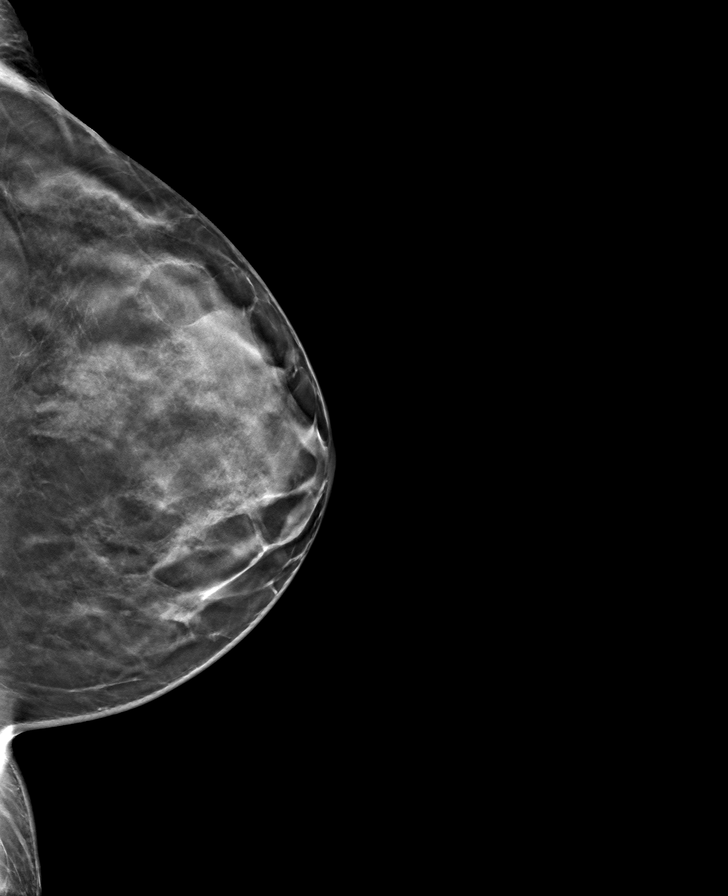

[R MLO tomo · tomo slice 37/72.0]
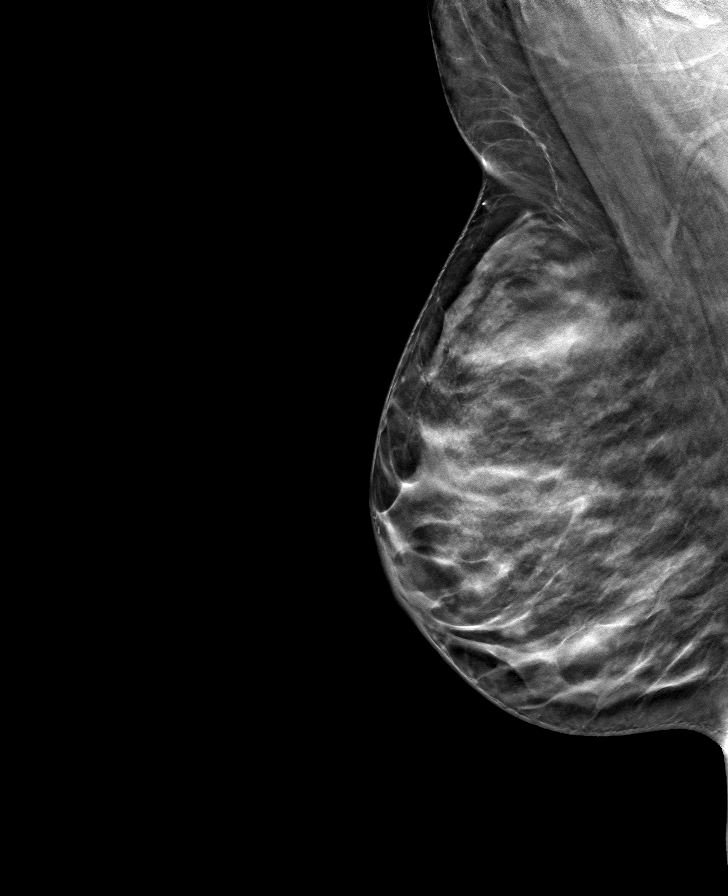

[L MLO tomo · tomo slice 35/69.0]
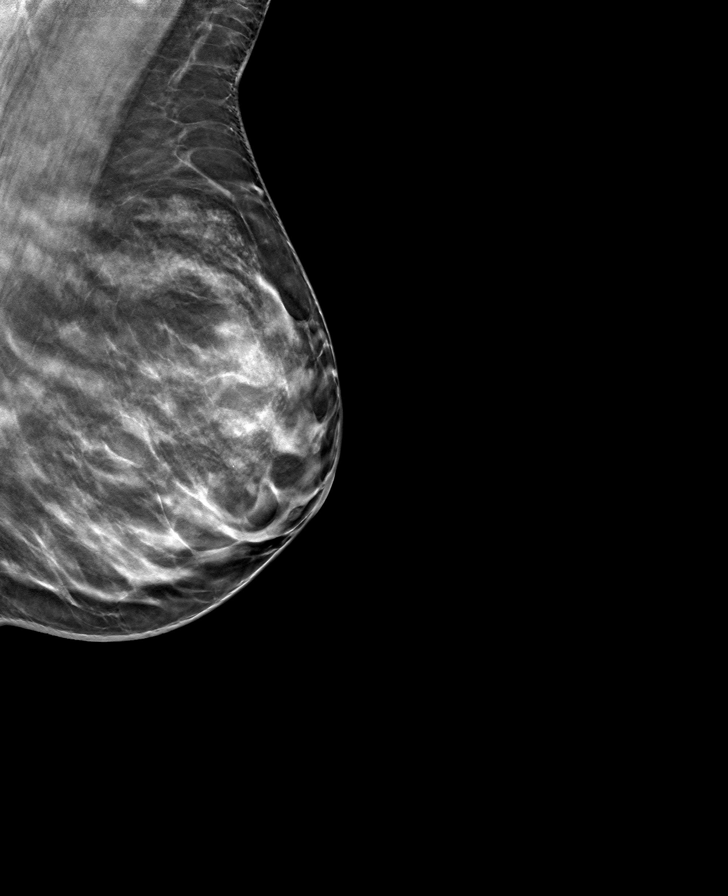

[R CC tomo · tomo slice 33/65.0]
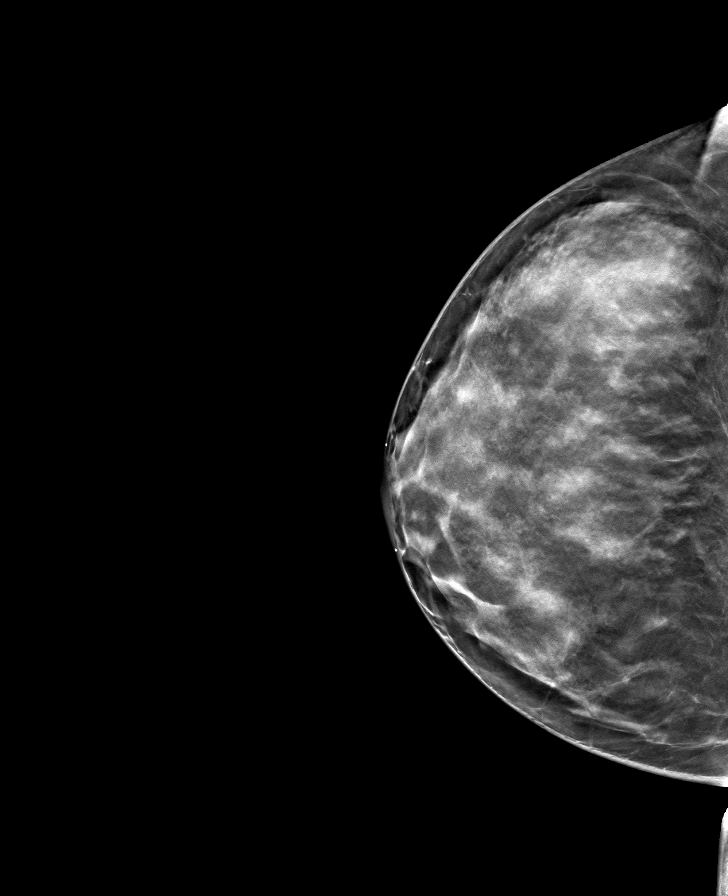

[8 of 24 positions shown; findings below may reference images not displayed]

ACR Breast Density Category c: The breast tissue is heterogeneously
dense, which may obscure small masses.
FINDINGS: There are no findings suspicious for malignancy. Images were
processed with CAD.
IMPRESSION: No mammographic evidence of malignancy. A result letter of this
screening mammogram will be mailed directly to the patient.

RECOMMENDATION:
Screening mammogram in one year. (Code:[id number]-[id number])

BI-RADS CATEGORY  1: Negative.

## 2019-08-05 DIAGNOSIS — Z03818 Encounter for observation for suspected exposure to other biological agents ruled out: Secondary | ICD-10-CM | POA: Diagnosis not present

## 2019-08-05 DIAGNOSIS — Z20822 Contact with and (suspected) exposure to covid-19: Secondary | ICD-10-CM | POA: Diagnosis not present

## 2019-08-14 DIAGNOSIS — Z03818 Encounter for observation for suspected exposure to other biological agents ruled out: Secondary | ICD-10-CM | POA: Diagnosis not present

## 2019-08-14 DIAGNOSIS — Z20822 Contact with and (suspected) exposure to covid-19: Secondary | ICD-10-CM | POA: Diagnosis not present

## 2019-08-15 DIAGNOSIS — Z20822 Contact with and (suspected) exposure to covid-19: Secondary | ICD-10-CM | POA: Diagnosis not present

## 2019-08-30 ENCOUNTER — Other Ambulatory Visit: Payer: Self-pay | Admitting: Obstetrics & Gynecology

## 2019-08-30 DIAGNOSIS — Z1231 Encounter for screening mammogram for malignant neoplasm of breast: Secondary | ICD-10-CM

## 2019-09-08 ENCOUNTER — Encounter: Payer: Self-pay | Admitting: Genetic Counselor

## 2019-09-09 ENCOUNTER — Other Ambulatory Visit: Payer: Self-pay

## 2019-09-09 ENCOUNTER — Ambulatory Visit
Admission: RE | Admit: 2019-09-09 | Discharge: 2019-09-09 | Disposition: A | Discharge status: Home / Self Care | Payer: BC Managed Care – PPO | Source: Ambulatory Visit

## 2019-09-09 DIAGNOSIS — Z1231 Encounter for screening mammogram for malignant neoplasm of breast: Secondary | ICD-10-CM | POA: Diagnosis not present

## 2019-09-16 ENCOUNTER — Encounter: Payer: BC Managed Care – PPO | Admitting: Obstetrics & Gynecology

## 2019-09-19 DIAGNOSIS — Z20828 Contact with and (suspected) exposure to other viral communicable diseases: Secondary | ICD-10-CM | POA: Diagnosis not present

## 2019-09-29 ENCOUNTER — Encounter: Payer: Self-pay | Admitting: Obstetrics & Gynecology

## 2019-09-29 ENCOUNTER — Ambulatory Visit (INDEPENDENT_AMBULATORY_CARE_PROVIDER_SITE_OTHER): Payer: BC Managed Care – PPO | Admitting: Obstetrics & Gynecology

## 2019-09-29 ENCOUNTER — Other Ambulatory Visit: Payer: Self-pay

## 2019-09-29 VITALS — BP 144/90 | Ht 64.0 in | Wt 174.0 lb

## 2019-09-29 DIAGNOSIS — Z9189 Other specified personal risk factors, not elsewhere classified: Secondary | ICD-10-CM | POA: Diagnosis not present

## 2019-09-29 DIAGNOSIS — Z01419 Encounter for gynecological examination (general) (routine) without abnormal findings: Secondary | ICD-10-CM

## 2019-09-29 NOTE — Progress Notes (Signed)
Kirsten Coleman 08/19/1975 [id number]   History:    44 y.o.  Married G3P2A1L2 Married, Simon. Vasectomy.  Ian Malkin at [employer].  Son Paramedic in [employer].  Nurse, supervisor at [employer], started Coding course to change job.  RP:  Established patient presenting for annual gyn exam   HPI:  Menses regular normal every month.  Dysmeno, Ibuprofen.  No BTB.  No pelvic pain.  No pain with IC.  Normal vaginal secretions.  Breasts normal.  BMI 29.87.  Bought a row boat...  Health labs with Fam MD who will organize her first screening Colono.  Father with h/o Colon Ca.  Past medical history,surgical history, family history and social history were all reviewed and documented in the EPIC chart.  Gynecologic History Patient's last menstrual period was 09/24/2019.  Obstetric History OB History  Gravida Para Term Preterm AB Living  3 2     1 2   SAB TAB Ectopic Multiple Live Births  1            # Outcome Date GA Lbr Len/2nd Weight Sex Delivery Anes PTL Lv  3 SAB           2 Para           1 Para              ROS: A ROS was performed and pertinent positives and negatives are included in the history.  GENERAL: No fevers or chills. HEENT: No change in vision, no earache, sore throat or sinus congestion. NECK: No pain or stiffness. CARDIOVASCULAR: No chest pain or pressure. No palpitations. PULMONARY: No shortness of breath, cough or wheeze. GASTROINTESTINAL: No abdominal pain, nausea, vomiting or diarrhea, melena or bright red blood per rectum. GENITOURINARY: No urinary frequency, urgency, hesitancy or dysuria. MUSCULOSKELETAL: No joint or muscle pain, no back pain, no recent trauma. DERMATOLOGIC: No rash, no itching, no lesions. ENDOCRINE: No polyuria, polydipsia, no heat or cold intolerance. No recent change in weight. HEMATOLOGICAL: No anemia or easy bruising or bleeding. NEUROLOGIC: No headache, seizures, numbness, tingling or weakness. PSYCHIATRIC: No depression, no loss of interest in  normal activity or change in sleep pattern.     Exam:   BP (!) 144/90   Ht 5\' 4"  (1.626 m)   Wt 174 lb (78.9 kg)   LMP 09/24/2019   BMI 29.87 kg/m   Body mass index is 29.87 kg/m.  General appearance : Well developed well nourished female. No acute distress HEENT: Eyes: no retinal hemorrhage or exudates,  Neck supple, trachea midline, no carotid bruits, no thyroidmegaly Lungs: Clear to auscultation, no rhonchi or wheezes, or rib retractions  Heart: Regular rate and rhythm, no murmurs or gallops Breast:Examined in sitting and supine position were symmetrical in appearance, no palpable masses or tenderness,  no skin retraction, no nipple inversion, no nipple discharge, no skin discoloration, no axillary or supraclavicular lymphadenopathy Abdomen: no palpable masses or tenderness, no rebound or guarding Extremities: no edema or skin discoloration or tenderness  Pelvic: Vulva: Normal             Vagina: No gross lesions or discharge  Cervix: No gross lesions or discharge  Uterus AV, normal size, shape and consistency, non-tender and mobile  Adnexa  Without masses or tenderness  Anus: Normal   Assessment/Plan:  44 y.o. female for annual exam   1. Well female exam with routine gynecological exam Normal gynecologic exam.  Last Pap test August 2020 was negative, no indication to  repeat this year.  Breast exam normal.  Screening mammogram August 2021 was negative.  Will organize screening colonoscopy through family physician.  Father with history of colon cancer.  Body mass index 29.87.  Recommend a slightly lower calorie/carb diet.  Continue with fitness.  2. Relies on partner vasectomy for contraception  Other orders - cholecalciferol (VITAMIN D3) 25 MCG (1000 UNIT) tablet; Take 1,000 Units by mouth daily. - b complex vitamins tablet; Take 1 tablet by mouth daily.  Princess Bruins MD, 11:41 AM 09/29/2019

## 2020-01-21 DIAGNOSIS — Z8616 Personal history of COVID-19: Secondary | ICD-10-CM

## 2020-01-21 HISTORY — DX: Personal history of COVID-19: Z86.16

## 2020-07-30 ENCOUNTER — Other Ambulatory Visit: Payer: Self-pay | Admitting: Obstetrics & Gynecology

## 2020-07-30 DIAGNOSIS — Z1231 Encounter for screening mammogram for malignant neoplasm of breast: Secondary | ICD-10-CM

## 2020-08-08 ENCOUNTER — Other Ambulatory Visit (HOSPITAL_COMMUNITY): Payer: Self-pay

## 2020-09-27 ENCOUNTER — Ambulatory Visit
Admission: RE | Admit: 2020-09-27 | Discharge: 2020-09-27 | Disposition: A | Discharge status: Home / Self Care | Payer: BC Managed Care – PPO | Source: Ambulatory Visit

## 2020-09-27 ENCOUNTER — Other Ambulatory Visit: Payer: Self-pay

## 2020-09-27 DIAGNOSIS — Z1231 Encounter for screening mammogram for malignant neoplasm of breast: Secondary | ICD-10-CM

## 2020-11-13 ENCOUNTER — Other Ambulatory Visit: Payer: Self-pay

## 2020-11-13 ENCOUNTER — Ambulatory Visit (INDEPENDENT_AMBULATORY_CARE_PROVIDER_SITE_OTHER): Payer: BC Managed Care – PPO | Admitting: Obstetrics & Gynecology

## 2020-11-13 ENCOUNTER — Other Ambulatory Visit (HOSPITAL_COMMUNITY)
Admission: RE | Admit: 2020-11-13 | Discharge: 2020-11-13 | Disposition: A | Discharge status: Home / Self Care | Payer: BC Managed Care – PPO | Source: Ambulatory Visit | Attending: Obstetrics & Gynecology | Admitting: Obstetrics & Gynecology

## 2020-11-13 ENCOUNTER — Encounter: Payer: Self-pay | Admitting: Obstetrics & Gynecology

## 2020-11-13 VITALS — BP 120/76 | HR 74 | Resp 16 | Ht 64.75 in | Wt 169.0 lb

## 2020-11-13 DIAGNOSIS — Z9189 Other specified personal risk factors, not elsewhere classified: Secondary | ICD-10-CM

## 2020-11-13 DIAGNOSIS — N920 Excessive and frequent menstruation with regular cycle: Secondary | ICD-10-CM

## 2020-11-13 DIAGNOSIS — Z01419 Encounter for gynecological examination (general) (routine) without abnormal findings: Secondary | ICD-10-CM | POA: Insufficient documentation

## 2020-11-13 NOTE — Progress Notes (Addendum)
Kirsten Coleman September 01, 1975 [id number]   History:    45 y.o. Married G3P2A1L2 Married, Kirsten Coleman. Vasectomy.  Kirsten Coleman at [employer].  Son Kirsten Coleman in [employer].  Nurse, did a Medical Coding course to work remote.  RP:  Established patient presenting for annual gyn exam   HPI: Menses regular every month, but heavy flow now.  Dysmeno, Ibuprofen.  No BTB., but some cramping especially post coital.  No pain during IC.  Normal vaginal secretions.  Breasts normal.  BMI 28.34.  Good fitness.  Health labs with Fam MD.  Father with h/o Colon Ca, will schedule Colono now.    Past medical history,surgical history, family history and social history were all reviewed and documented in the EPIC chart.  Gynecologic History Patient's last menstrual period was 10/28/2020 (exact date).  Obstetric History OB History  Gravida Para Term Preterm AB Living  3 2     1 2   SAB IAB Ectopic Multiple Live Births  1            # Outcome Date GA Lbr Len/2nd Weight Sex Delivery Anes PTL Lv  3 SAB           2 Para           1 Para              ROS: A ROS was performed and pertinent positives and negatives are included in the history.  GENERAL: No fevers or chills. HEENT: No change in vision, no earache, sore throat or sinus congestion. NECK: No pain or stiffness. CARDIOVASCULAR: No chest pain or pressure. No palpitations. PULMONARY: No shortness of breath, cough or wheeze. GASTROINTESTINAL: No abdominal pain, nausea, vomiting or diarrhea, melena or bright red blood per rectum. GENITOURINARY: No urinary frequency, urgency, hesitancy or dysuria. MUSCULOSKELETAL: No joint or muscle pain, no back pain, no recent trauma. DERMATOLOGIC: No rash, no itching, no lesions. ENDOCRINE: No polyuria, polydipsia, no heat or cold intolerance. No recent change in weight. HEMATOLOGICAL: No anemia or easy bruising or bleeding. NEUROLOGIC: No headache, seizures, numbness, tingling or weakness. PSYCHIATRIC: No depression, no loss of  interest in normal activity or change in sleep pattern.     Exam:   BP 120/76   Pulse 74   Resp 16   Ht 5' 4.75" (1.645 m)   Wt 169 lb (76.7 kg)   LMP 10/28/2020 (Exact Date)   BMI 28.34 kg/m   Body mass index is 28.34 kg/m.  General appearance : Well developed well nourished female. No acute distress HEENT: Eyes: no retinal hemorrhage or exudates,  Neck supple, trachea midline, no carotid bruits, no thyroidmegaly Lungs: Clear to auscultation, no rhonchi or wheezes, or rib retractions  Heart: Regular rate and rhythm, no murmurs or gallops Breast:Examined in sitting and supine position were symmetrical in appearance, no palpable masses or tenderness,  no skin retraction, no nipple inversion, no nipple discharge, no skin discoloration, no axillary or supraclavicular lymphadenopathy Abdomen: no palpable masses or tenderness, no rebound or guarding Extremities: no edema or skin discoloration or tenderness  Pelvic: Vulva: Normal             Vagina: No gross lesions or discharge  Cervix: No gross lesions or discharge.  Pap reflex done.  Uterus  AV, normal size, shape and consistency, non-tender and mobile  Adnexa  Without masses or tenderness  Anus: Normal   Assessment/Plan:  45 y.o. female for annual exam   1. Encounter for routine gynecological examination with  Papanicolaou smear of cervix Menses regular every month, but heavy flow now.  Dysmeno, Ibuprofen.  No BTB., but some cramping especially post coital.  No pain during IC.  Normal vaginal secretions.  Breasts normal.  BMI 28.34.  Good fitness.  Health labs with Fam MD.  Father with h/o Colon Ca, will schedule Colono now. - Cytology - PAP( [place] [facility])  2. Relies on partner vasectomy for contraception  3. Menorrhagia with regular cycle Menses regular every month, but heavy flow now.  Dysmeno, Ibuprofen.  No BTB., but some cramping especially post coital.  No pain during IC. Decision to further investigate with a Pelvic US  at f/u. - US Transvaginal Non-OB; Future  Other orders - UNABLE TO FIND; Bullet proof forbose (energy) - Misc Natural Products (GLUCOSAMINE CHOND COMPLEX/MSM PO)   Kirsten Bruins MD, 3:21 PM 11/13/2020

## 2020-11-14 LAB — CYTOLOGY - PAP: Diagnosis: NEGATIVE

## 2020-11-16 ENCOUNTER — Encounter: Payer: Self-pay | Admitting: Obstetrics & Gynecology

## 2020-12-11 ENCOUNTER — Other Ambulatory Visit: Payer: Self-pay | Admitting: Gastroenterology

## 2020-12-11 ENCOUNTER — Other Ambulatory Visit (HOSPITAL_COMMUNITY): Payer: Self-pay | Admitting: Gastroenterology

## 2020-12-11 DIAGNOSIS — R1011 Right upper quadrant pain: Secondary | ICD-10-CM

## 2020-12-12 ENCOUNTER — Other Ambulatory Visit (HOSPITAL_COMMUNITY): Payer: Self-pay | Admitting: Gastroenterology

## 2020-12-12 DIAGNOSIS — R1011 Right upper quadrant pain: Secondary | ICD-10-CM

## 2020-12-21 ENCOUNTER — Other Ambulatory Visit: Payer: Self-pay

## 2020-12-21 ENCOUNTER — Ambulatory Visit (HOSPITAL_COMMUNITY)
Admission: RE | Admit: 2020-12-21 | Discharge: 2020-12-21 | Disposition: A | Discharge status: Home / Self Care | Payer: BC Managed Care – PPO | Source: Ambulatory Visit | Attending: Gastroenterology | Admitting: Gastroenterology

## 2020-12-21 DIAGNOSIS — R1011 Right upper quadrant pain: Secondary | ICD-10-CM

## 2020-12-21 MED ORDER — TECHNETIUM TC 99M MEBROFENIN IV KIT
5.3000 | PACK | Freq: Once | INTRAVENOUS | Status: AC
  Administered 2020-12-21: 5.3 via INTRAVENOUS

## 2020-12-25 ENCOUNTER — Other Ambulatory Visit: Payer: BC Managed Care – PPO | Admitting: Obstetrics & Gynecology

## 2020-12-25 ENCOUNTER — Other Ambulatory Visit: Payer: BC Managed Care – PPO

## 2020-12-27 ENCOUNTER — Encounter: Payer: Self-pay | Admitting: Obstetrics & Gynecology

## 2020-12-27 ENCOUNTER — Other Ambulatory Visit: Payer: BC Managed Care – PPO

## 2020-12-27 ENCOUNTER — Other Ambulatory Visit: Payer: BC Managed Care – PPO | Admitting: Obstetrics & Gynecology

## 2020-12-27 ENCOUNTER — Ambulatory Visit (INDEPENDENT_AMBULATORY_CARE_PROVIDER_SITE_OTHER): Payer: BC Managed Care – PPO | Admitting: Obstetrics & Gynecology

## 2020-12-27 ENCOUNTER — Ambulatory Visit (INDEPENDENT_AMBULATORY_CARE_PROVIDER_SITE_OTHER): Payer: BC Managed Care – PPO

## 2020-12-27 ENCOUNTER — Other Ambulatory Visit: Payer: Self-pay

## 2020-12-27 VITALS — BP 110/70

## 2020-12-27 DIAGNOSIS — D250 Submucous leiomyoma of uterus: Secondary | ICD-10-CM | POA: Diagnosis not present

## 2020-12-27 DIAGNOSIS — D251 Intramural leiomyoma of uterus: Secondary | ICD-10-CM

## 2020-12-27 DIAGNOSIS — N920 Excessive and frequent menstruation with regular cycle: Secondary | ICD-10-CM

## 2020-12-27 DIAGNOSIS — D252 Subserosal leiomyoma of uterus: Secondary | ICD-10-CM

## 2020-12-27 DIAGNOSIS — Z9189 Other specified personal risk factors, not elsewhere classified: Secondary | ICD-10-CM

## 2020-12-27 NOTE — Progress Notes (Signed)
Kirsten Coleman 08-03-1975 [id number]        45 y.o.  [id number]  Married  RP: Menorrhagia for Pelvic US  HPI: Progressively heavier menses with more uterine cramps.  No BTB.  Recently experienced painful cramping with IC.  No abnormal vaginal discharge.  Vasectomy for contraception.   OB History  Gravida Para Term Preterm AB Living  3 2     1 2   SAB IAB Ectopic Multiple Live Births  1            # Outcome Date GA Lbr Len/2nd Weight Sex Delivery Anes PTL Lv  3 SAB           2 Para           1 Para             Past medical history,surgical history, problem list, medications, allergies, family history and social history were all reviewed and documented in the EPIC chart.   Directed ROS with pertinent positives and negatives documented in the history of present illness/assessment and plan.  Exam:  Vitals:   12/27/20 1417  BP: 110/70   General appearance:  Normal  Pelvic US today: T/V images.  Comparison is made with previous scan in 2019.  Retroverted uterus normal in size and shape with several intramural and subserosal fibroids.  The largest fibroid is measured at 1.8 cm and is subserosal.  The 2 intramural fibroids are measured at 0.6 cm each.  No fibroids were seen on the previous scan.  1 fibroid is partly submucosal with distortion of the endometrial cavity posteriorly.  That fibroid is measured at 1.5 x 1.4 cm.  The overall uterine size is measured at 8.37 x 6.2 x 4.5 cm.  The endometrial lining is symmetrical measured at 6.3 mm with no mass or thickening seen.  Both ovaries are mobile, normal in size with normal follicular pattern and normal perfusion.  No adnexal mass.  No free fluid in the pelvis.   Assessment/Plan:  45 y.o. [id number]   1. Menorrhagia with regular cycle Progressively heavier menses with more uterine cramps.  No BTB.  Recently experienced painful cramping with IC.  No abnormal vaginal discharge.  Pelvic US findings thoroughly reviewed with patient.   New diagnosis of uterine fibroids.  1 partially submucosal fibroid is measured at 1.5 x 1.4 cm.  Given that that fibroid is probably the main cause of menorrhagia and uterine cramping, the decision was made to proceed with excision of that fibroid under hysteroscopy.  We will schedule hysteroscopy with MyoSure XL excision of submucosal fibroid and dilation and curettage.  Preop visit done today with discussion of preop preparation, surgery with risks and benefits as well as postop precautions and expectations.  The risks of uterine perforation, infection, blood clots and anesthesia were discussed.  Pamphlets were given.  The possibility of adding a NovaSure endometrial ablation was discussed.  The risks and benefits of endometrial ablation were reviewed.  Patient will call back if desires adding that procedure.  2. Intramural, submucous, and subserous leiomyoma of uterus Schedule HSC/Myosure XL Excision, D+C.  Novasure Endometrial Ablation discussed.  Pamphlets given.  3. Relies on partner vasectomy for contraception                         Patient was counseled as to the risk of surgery to include the following:  1. Infection (prohylactic antibiotics will be administered)  2. DVT/Pulmonary Embolism (prophylactic pneumo  compression stockings will be used)  3.Trauma to internal organs requiring additional surgical procedure to repair any injury to internal organs requiring perhaps additional hospitalization days.  4.Hemmorhage requiring transfusion and blood products which carry risks such as anaphylactic reaction, hepatitis and AIDS  Patient had received literature information on the procedure scheduled and all her questions were answered and fully accepts all risk.    Princess Bruins MD, 3:14 PM 12/27/2020

## 2020-12-28 ENCOUNTER — Telehealth: Payer: Self-pay

## 2020-12-28 NOTE — Telephone Encounter (Signed)
-----   Message from Princess Bruins, MD sent at 12/27/2020  3:16 PM EST ----- Regarding: Schedule surg Surgery: Hysteroscopy, Myosure XL Excision, Dilation and Curettage  Diagnosis:  Menorrhagia/Submucosal Fibroid (1.5 cm)  Location: [facility]  Status: Outpatient  Time: 30 Minutes  Assistant: N/A  Urgency: First Available  Pre-Op Appointment: Completed  Post-Op Appointment(s): 2 Weeks  Time Out Of Work: Day Of Surgery, 1 Day Post Op

## 2020-12-31 NOTE — Telephone Encounter (Signed)
Spoke with patient. Reviewed surgery dates. Patient request to proceed with surgery on 02/06/21.  Advised patient I will forward to business office for return call. I will return call once surgery date and time confirmed. Patient verbalizes understanding and is agreeable.   Surgery request sent.

## 2020-12-31 NOTE — Telephone Encounter (Signed)
Spoke with patient.  Patient is on a walk, will return call later today to further discuss surgery dates.

## 2021-01-02 NOTE — Telephone Encounter (Signed)
Spoke with patient. Surgery date request confirmed.  Advised surgery is scheduled for 02/06/21, [facility] at 0830.  Surgery instruction sheet and hospital brochure reviewed, printed copy will be mailed. Patient advised if Covid screening and quarantine requirements and agreeable.   Patient is aware benefits will be reviewed and she will be contacted after 01/22/21.   Routing to [employer]

## 2021-01-22 NOTE — Telephone Encounter (Signed)
Call to patient. Per DPR, OK to leave message on voicemail.   Left voicemail requesting a return call to review benefits for Scheduled Surgery with Marie-Lyne Lavoie, MD, FACOG, FRCSC, MA.  

## 2021-01-24 NOTE — Telephone Encounter (Signed)
Dr. Dellis Filbert -ok to update surgery to include endometrial ablation?   Patient is currently scheduled for Hysteroscopy D&C Myosure XL Excision on 02/06/21

## 2021-01-24 NOTE — Telephone Encounter (Signed)
Spoke with patient regarding surgery benefits. Patient acknowledges understanding of information presented. Patient is aware that benefits presented are professional benefits only. Patient is aware the hospital will call with facility benefits. See account note.  Patient stated that she was told she could add on an endometrial ablation the day of surgery, if she decided to proceed with it. Informed patient that I would have to speak with our surgery scheduler regarding adding it, as it is an additional procedure, a different CPT code, and may require additional time.  Patient expresses understanding and is appreciative.  Routing to Glorianne Manchester, Therapist, sports.

## 2021-01-28 ENCOUNTER — Other Ambulatory Visit: Payer: Self-pay

## 2021-01-28 ENCOUNTER — Encounter: Payer: Self-pay | Admitting: Obstetrics & Gynecology

## 2021-01-28 ENCOUNTER — Ambulatory Visit (INDEPENDENT_AMBULATORY_CARE_PROVIDER_SITE_OTHER): Payer: BC Managed Care – PPO | Admitting: Obstetrics & Gynecology

## 2021-01-28 VITALS — BP 132/82 | HR 60

## 2021-01-28 DIAGNOSIS — N920 Excessive and frequent menstruation with regular cycle: Secondary | ICD-10-CM

## 2021-01-28 DIAGNOSIS — D250 Submucous leiomyoma of uterus: Secondary | ICD-10-CM

## 2021-01-28 DIAGNOSIS — D251 Intramural leiomyoma of uterus: Secondary | ICD-10-CM

## 2021-01-28 DIAGNOSIS — Z9189 Other specified personal risk factors, not elsewhere classified: Secondary | ICD-10-CM | POA: Diagnosis not present

## 2021-01-28 DIAGNOSIS — D252 Subserosal leiomyoma of uterus: Secondary | ICD-10-CM

## 2021-01-28 NOTE — Telephone Encounter (Signed)
Spoke with Kirsten Coleman in [employer], case updated to add possible Novasure Endometrial Ablation.

## 2021-01-28 NOTE — Progress Notes (Signed)
Kirsten Coleman Feb 26, 1975 [id number]        46 y.o.  [id number]   RP: Preop HSC/Myosure/D+C/Novasure Endometrial Ablation 02/06/2021  HPI: Preop HSC/Myosure/D+C/Novasure Endometrial Ablation 02/06/2021. Progressively heavier menses with more uterine cramps.  No BTB.  Recently experienced painful cramping with IC.  No abnormal vaginal discharge.  Vasectomy for contraception.  Pelvic US showed a SM Myoma.   OB History  Gravida Para Term Preterm AB Living  3 2     1 2   SAB IAB Ectopic Multiple Live Births  1            # Outcome Date GA Lbr Len/2nd Weight Sex Delivery Anes PTL Lv  3 SAB           2 Para           1 Para             Past medical history,surgical history, problem list, medications, allergies, family history and social history were all reviewed and documented in the EPIC chart.   Directed ROS with pertinent positives and negatives documented in the history of present illness/assessment and plan.  Exam:  Vitals:   01/28/21 1125  BP: 132/82  Pulse: 60  SpO2: 94%   General appearance:  Normal  Pelvic US 12/27/2020: T/V images.  Comparison is made with previous scan in 2019.  Retroverted uterus normal in size and shape with several intramural and subserosal fibroids.  The largest fibroid is measured at 1.8 cm and is subserosal.  The 2 intramural fibroids are measured at 0.6 cm each.  No fibroids were seen on the previous scan.  1 fibroid is partly submucosal with distortion of the endometrial cavity posteriorly.  That fibroid is measured at 1.5 x 1.4 cm.  The overall uterine size is measured at 8.37 x 6.2 x 4.5 cm.  The endometrial lining is symmetrical measured at 6.3 mm with no mass or thickening seen.  Both ovaries are mobile, normal in size with normal follicular pattern and normal perfusion.  No adnexal mass.  No free fluid in the pelvis.     Assessment/Plan:  46 y.o. [id number]    1. Menorrhagia with regular cycle Progressively heavier menses with more uterine  cramps.  No BTB.  Recently experienced painful cramping with IC.  No abnormal vaginal discharge.  Pelvic US findings thoroughly reviewed with patient.  New diagnosis of uterine fibroids.  1 partially submucosal fibroid is measured at 1.5 x 1.4 cm.  Given that that fibroid is probably the main cause of menorrhagia and uterine cramping, the decision was made to proceed with excision of that fibroid under hysteroscopy.  We will schedule hysteroscopy with MyoSure XL excision of submucosal fibroid and dilation and curettage and Novasure Endometrial Ablation.  Preop visit done today with discussion of preop preparation, surgery with risks and benefits as well as postop precautions and expectations.  The risks of uterine perforation, infection, blood clots and anesthesia were discussed.  Pamphlets were given at last visit.     2. Intramural, submucous, and subserous leiomyoma of uterus Schedule HSC/Myosure XL Excision, D+C, Novasure Endometrial Ablation.     3. Relies on partner vasectomy for contraception                          Patient was counseled as to the risk of surgery to include the following:  1. Infection (prohylactic antibiotics will be administered)   2. DVT/Pulmonary Embolism (prophylactic pneumo  compression stockings will be used)   3.Trauma to internal organs requiring additional surgical procedure to repair any injury to internal organs requiring perhaps additional hospitalization days.   4.Hemmorhage requiring transfusion and blood products which carry risks such as anaphylactic reaction, hepatitis and AIDS   Patient had received literature information on the procedure scheduled and all her questions were answered and fully accepts all risk.    Princess Bruins MD, 2:15 PM 01/28/2021

## 2021-01-28 NOTE — Telephone Encounter (Signed)
Kirsten Bruins, MD  You 3 days ago   Yes, I agree with a Novasure Endometrial Ablation to be added to the current surgery.  Routing to [employer]

## 2021-02-01 ENCOUNTER — Other Ambulatory Visit: Payer: Self-pay

## 2021-02-01 ENCOUNTER — Encounter (HOSPITAL_BASED_OUTPATIENT_CLINIC_OR_DEPARTMENT_OTHER): Payer: Self-pay | Admitting: Obstetrics & Gynecology

## 2021-02-01 DIAGNOSIS — N920 Excessive and frequent menstruation with regular cycle: Secondary | ICD-10-CM

## 2021-02-01 HISTORY — DX: Excessive and frequent menstruation with regular cycle: N92.0

## 2021-02-01 NOTE — Progress Notes (Signed)
Spoke w/ via phone for pre-op interview---pt Lab needs dos----   urine preg, cbc            Lab results------none COVID test ----Monday 02-04-2021 in am due to outside of [place] travel in last 30 days Arrive at -------630 am 02-06-2021 NPO after MN NO Solid Food.  Clear liquids from MN until--- Med rec completed Medications to take morning of surgery ----- Diabetic medication ----- Patient instructed no nail polish to be worn day of surgery Patient instructed to bring photo id and insurance card day of surgery Patient aware to have Driver (ride ) / caregiver    for 24 hours after surgery husband simon Patient Special Instructions -----none Pre-Op special Istructions -----none Patient verbalized understanding of instructions that were given at this phone interview. Patient denies shortness of breath, chest pain, fever, cough at this phone interview.

## 2021-02-04 ENCOUNTER — Other Ambulatory Visit: Payer: Self-pay | Admitting: Obstetrics & Gynecology

## 2021-02-04 LAB — SARS CORONAVIRUS 2 (TAT 6-24 HRS): SARS Coronavirus 2: NEGATIVE

## 2021-02-06 ENCOUNTER — Ambulatory Visit (HOSPITAL_BASED_OUTPATIENT_CLINIC_OR_DEPARTMENT_OTHER): Payer: BC Managed Care – PPO | Admitting: Anesthesiology

## 2021-02-06 ENCOUNTER — Ambulatory Visit (HOSPITAL_BASED_OUTPATIENT_CLINIC_OR_DEPARTMENT_OTHER)
Admission: RE | Admit: 2021-02-06 | Discharge: 2021-02-06 | Disposition: A | Discharge status: Home / Self Care | Payer: BC Managed Care – PPO | Attending: Obstetrics & Gynecology | Admitting: Obstetrics & Gynecology

## 2021-02-06 ENCOUNTER — Encounter (HOSPITAL_BASED_OUTPATIENT_CLINIC_OR_DEPARTMENT_OTHER): Payer: Self-pay | Admitting: Obstetrics & Gynecology

## 2021-02-06 ENCOUNTER — Encounter (HOSPITAL_BASED_OUTPATIENT_CLINIC_OR_DEPARTMENT_OTHER)
Admission: RE | Disposition: A | Discharge status: Home / Self Care | Payer: Self-pay | Source: Home / Self Care | Attending: Obstetrics & Gynecology

## 2021-02-06 DIAGNOSIS — N920 Excessive and frequent menstruation with regular cycle: Secondary | ICD-10-CM | POA: Diagnosis present

## 2021-02-06 DIAGNOSIS — M1990 Unspecified osteoarthritis, unspecified site: Secondary | ICD-10-CM | POA: Insufficient documentation

## 2021-02-06 DIAGNOSIS — D250 Submucous leiomyoma of uterus: Secondary | ICD-10-CM | POA: Insufficient documentation

## 2021-02-06 DIAGNOSIS — Z01818 Encounter for other preprocedural examination: Secondary | ICD-10-CM

## 2021-02-06 HISTORY — DX: Excessive and frequent menstruation with regular cycle: N92.0

## 2021-02-06 HISTORY — DX: Presence of spectacles and contact lenses: Z97.3

## 2021-02-06 HISTORY — DX: Pelvic and perineal pain: R10.2

## 2021-02-06 HISTORY — PX: DILATATION & CURETTAGE/HYSTEROSCOPY WITH MYOSURE: SHX6511

## 2021-02-06 LAB — CBC
HCT: 41.9 % (ref 36.0–46.0)
Hemoglobin: 14.1 g/dL (ref 12.0–15.0)
MCH: 30.9 pg (ref 26.0–34.0)
MCHC: 33.7 g/dL (ref 30.0–36.0)
MCV: 91.9 fL (ref 80.0–100.0)
Platelets: 254 10*3/uL (ref 150–400)
RBC: 4.56 MIL/uL (ref 3.87–5.11)
RDW: 13.2 % (ref 11.5–15.5)
WBC: 5.1 10*3/uL (ref 4.0–10.5)
nRBC: 0 % (ref 0.0–0.2)

## 2021-02-06 LAB — POCT PREGNANCY, URINE: Preg Test, Ur: NEGATIVE

## 2021-02-06 SURGERY — DILATATION & CURETTAGE/HYSTEROSCOPY WITH MYOSURE
Anesthesia: General | Site: Vagina

## 2021-02-06 MED ORDER — AMISULPRIDE (ANTIEMETIC) 5 MG/2ML IV SOLN: 10.0000 mg | Freq: Once | INTRAVENOUS | Status: DC | PRN

## 2021-02-06 MED ORDER — FENTANYL CITRATE (PF) 100 MCG/2ML IJ SOLN
25.0000 ug | INTRAMUSCULAR | Status: DC | PRN
  Administered 2021-02-06: 25 ug via INTRAVENOUS

## 2021-02-06 MED ORDER — OXYCODONE HCL 5 MG PO TABS
ORAL_TABLET | ORAL | Status: AC
  Filled 2021-02-06: qty 1

## 2021-02-06 MED ORDER — LACTATED RINGERS IV SOLN: INTRAVENOUS | Status: DC

## 2021-02-06 MED ORDER — KETOROLAC TROMETHAMINE 30 MG/ML IJ SOLN
INTRAMUSCULAR | Status: DC | PRN
  Administered 2021-02-06: 30 mg via INTRAVENOUS

## 2021-02-06 MED ORDER — CEFAZOLIN SODIUM-DEXTROSE 2-4 GM/100ML-% IV SOLN
2.0000 g | INTRAVENOUS | Status: AC
  Administered 2021-02-06: 2 g via INTRAVENOUS

## 2021-02-06 MED ORDER — OXYCODONE HCL 5 MG/5ML PO SOLN: 5.0000 mg | Freq: Once | ORAL | Status: AC | PRN

## 2021-02-06 MED ORDER — POVIDONE-IODINE 10 % EX SWAB: 2.0000 "application " | Freq: Once | CUTANEOUS | Status: DC

## 2021-02-06 MED ORDER — OXYCODONE HCL 5 MG PO TABS
5.0000 mg | ORAL_TABLET | Freq: Once | ORAL | Status: AC | PRN
  Administered 2021-02-06: 5 mg via ORAL

## 2021-02-06 MED ORDER — MIDAZOLAM HCL 5 MG/5ML IJ SOLN
INTRAMUSCULAR | Status: DC | PRN
  Administered 2021-02-06: 2 mg via INTRAVENOUS

## 2021-02-06 MED ORDER — DEXAMETHASONE SODIUM PHOSPHATE 10 MG/ML IJ SOLN
INTRAMUSCULAR | Status: DC | PRN
  Administered 2021-02-06: 10 mg via INTRAVENOUS

## 2021-02-06 MED ORDER — ACETAMINOPHEN 500 MG PO TABS
ORAL_TABLET | ORAL | Status: AC
  Filled 2021-02-06: qty 2

## 2021-02-06 MED ORDER — FENTANYL CITRATE (PF) 100 MCG/2ML IJ SOLN
INTRAMUSCULAR | Status: AC
  Filled 2021-02-06: qty 2

## 2021-02-06 MED ORDER — PROPOFOL 10 MG/ML IV BOLUS
INTRAVENOUS | Status: DC | PRN
  Administered 2021-02-06: 150 mg via INTRAVENOUS

## 2021-02-06 MED ORDER — ACETAMINOPHEN 500 MG PO TABS
1000.0000 mg | ORAL_TABLET | Freq: Once | ORAL | Status: AC
  Administered 2021-02-06: 1000 mg via ORAL

## 2021-02-06 MED ORDER — PROMETHAZINE HCL 25 MG/ML IJ SOLN: 6.2500 mg | INTRAMUSCULAR | Status: DC | PRN

## 2021-02-06 MED ORDER — ONDANSETRON HCL 4 MG/2ML IJ SOLN
INTRAMUSCULAR | Status: AC
  Filled 2021-02-06: qty 2

## 2021-02-06 MED ORDER — SODIUM CHLORIDE 0.9 % IR SOLN
Status: DC | PRN
  Administered 2021-02-06: 2100 mL

## 2021-02-06 MED ORDER — CHLOROPROCAINE HCL 1 % IJ SOLN
INTRAMUSCULAR | Status: DC | PRN
  Administered 2021-02-06: 20 mL

## 2021-02-06 MED ORDER — DEXAMETHASONE SODIUM PHOSPHATE 10 MG/ML IJ SOLN
INTRAMUSCULAR | Status: AC
  Filled 2021-02-06: qty 1

## 2021-02-06 MED ORDER — LIDOCAINE HCL (PF) 2 % IJ SOLN
INTRAMUSCULAR | Status: AC
  Filled 2021-02-06: qty 5

## 2021-02-06 MED ORDER — MIDAZOLAM HCL 2 MG/2ML IJ SOLN
INTRAMUSCULAR | Status: AC
  Filled 2021-02-06: qty 2

## 2021-02-06 MED ORDER — LIDOCAINE 2% (20 MG/ML) 5 ML SYRINGE
INTRAMUSCULAR | Status: DC | PRN
  Administered 2021-02-06: 60 mg via INTRAVENOUS

## 2021-02-06 MED ORDER — ONDANSETRON HCL 4 MG/2ML IJ SOLN
INTRAMUSCULAR | Status: DC | PRN
  Administered 2021-02-06: 4 mg via INTRAVENOUS

## 2021-02-06 MED ORDER — FENTANYL CITRATE (PF) 100 MCG/2ML IJ SOLN
INTRAMUSCULAR | Status: DC | PRN
  Administered 2021-02-06 (×2): 50 ug via INTRAVENOUS

## 2021-02-06 MED ORDER — KETOROLAC TROMETHAMINE 30 MG/ML IJ SOLN: 30.0000 mg | Freq: Once | INTRAMUSCULAR | Status: DC | PRN

## 2021-02-06 MED ORDER — CEFAZOLIN SODIUM-DEXTROSE 2-4 GM/100ML-% IV SOLN
INTRAVENOUS | Status: AC
  Filled 2021-02-06: qty 100

## 2021-02-06 MED ORDER — PROPOFOL 500 MG/50ML IV EMUL
INTRAVENOUS | Status: AC
  Filled 2021-02-06: qty 50

## 2021-02-06 SURGICAL SUPPLY — 30 items
ABLATOR SURESOUND NOVASURE (ABLATOR) ×3 IMPLANT
BAG WASTE FLUENT 6L (MISCELLANEOUS) IMPLANT
BAG WST MGMT FLUENT FLD DISP (MISCELLANEOUS)
BIPOLAR CUTTING LOOP 21FR (ELECTRODE)
CATH ROBINSON RED A/P 16FR (CATHETERS) ×1 IMPLANT
DEVICE MYOSURE LITE (MISCELLANEOUS) IMPLANT
DEVICE MYOSURE REACH (MISCELLANEOUS) IMPLANT
DILATOR CANAL MILEX (MISCELLANEOUS) ×1 IMPLANT
DRSG TELFA 3X8 NADH (GAUZE/BANDAGES/DRESSINGS) ×3 IMPLANT
ELECT REM PT RETURN 9FT ADLT (ELECTROSURGICAL)
ELECTRODE REM PT RTRN 9FT ADLT (ELECTROSURGICAL) IMPLANT
GAUZE 4X4 16PLY ~~LOC~~+RFID DBL (SPONGE) ×4 IMPLANT
GLOVE SURG ENC MOIS LTX SZ6.5 (GLOVE) ×3 IMPLANT
GLOVE SURG UNDER POLY LF SZ7 (GLOVE) ×6 IMPLANT
GOWN STRL REUS W/TWL LRG LVL3 (GOWN DISPOSABLE) ×6 IMPLANT
IV NS IRRIG 3000ML ARTHROMATIC (IV SOLUTION) ×3 IMPLANT
KIT PROCEDURE FLUENT (KITS) ×3 IMPLANT
KIT TURNOVER CYSTO (KITS) ×3 IMPLANT
LOOP CUTTING BIPOLAR 21FR (ELECTRODE) IMPLANT
MYOSURE XL FIBROID (MISCELLANEOUS) ×3
PACK VAGINAL MINOR WOMEN LF (CUSTOM PROCEDURE TRAY) ×3 IMPLANT
PAD DRESSING TELFA 3X8 NADH (GAUZE/BANDAGES/DRESSINGS) ×1 IMPLANT
PAD OB MATERNITY 4.3X12.25 (PERSONAL CARE ITEMS) ×3 IMPLANT
PAD PREP 24X48 CUFFED NSTRL (MISCELLANEOUS) ×3 IMPLANT
SEAL CERVICAL OMNI LOK (ABLATOR) IMPLANT
SEAL ROD LENS SCOPE MYOSURE (ABLATOR) ×3 IMPLANT
SOCKS TISSUE FLUENT (MISCELLANEOUS) IMPLANT
SYSTEM TISS REMOVAL MYOSURE XL (MISCELLANEOUS) ×1 IMPLANT
TOWEL OR 17X26 10 PK STRL BLUE (TOWEL DISPOSABLE) ×4 IMPLANT
WATER STERILE IRR 500ML POUR (IV SOLUTION) ×1 IMPLANT

## 2021-02-06 NOTE — Discharge Instructions (Addendum)
NO TYLENOL PRODUCTS UNTIL AFTER 1:30 PM THIS AFTERNOON.   NO IBUPROFEN CONTAINING PRODUCTS UNTIL AFTER 3:15 PM THIS AFTERNOON. (IBUPROFEN, ADVIL, MOTRIN, ALEVE, TORADOL).    DISCHARGE INSTRUCTIONS: HYSTEROSCOPY / ENDOMETRIAL ABLATION The following instructions have been prepared to help you care for yourself upon your return home.   Personal hygiene:  Use sanitary pads for vaginal drainage, not tampons.  Shower the day after your procedure.  NO tub baths, pools or Jacuzzis for 2-3 weeks.  Wipe front to back after using the bathroom.  Activity and limitations:  Do NOT drive or operate any equipment for 24 hours. The effects of anesthesia are still present and drowsiness may result.  Do NOT rest in bed all day.  Walking is encouraged.  Walk up and down stairs slowly.  You may resume your normal activity in one to two days or as indicated by your physician. Sexual activity: NO intercourse for at least 2 weeks after the procedure, or as indicated by your Doctor.  Diet: Eat a light meal as desired this evening. You may resume your usual diet tomorrow.  Return to Work: You may resume your work activities in one to two days or as indicated by Marine scientist.  What to expect after your surgery: Expect to have vaginal bleeding/discharge for 2-3 days and spotting for up to 10 days. It is not unusual to have soreness for up to 1-2 weeks. You may have a slight burning sensation when you urinate for the first day. Mild cramps may continue for a couple of days. You may have a regular period in 2-6 weeks.  Call your doctor for any of the following:  Excessive vaginal bleeding or clotting, saturating and changing one pad every hour.  Inability to urinate 6 hours after discharge from hospital.  Pain not relieved by pain medication.  Fever of 100.4 F or greater.  Unusual vaginal discharge or odor.    Post Anesthesia Home Care Instructions  Activity: Get plenty of rest for the remainder  of the day. A responsible individual must stay with you for 24 hours following the procedure.  For the next 24 hours, DO NOT: -Drive a car -Paediatric nurse -Drink alcoholic beverages -Take any medication unless instructed by your physician -Make any legal decisions or sign important papers.  Meals: Start with liquid foods such as gelatin or soup. Progress to regular foods as tolerated. Avoid greasy, spicy, heavy foods. If nausea and/or vomiting occur, drink only clear liquids until the nausea and/or vomiting subsides. Call your physician if vomiting continues.  Special Instructions/Symptoms: Your throat may feel dry or sore from the anesthesia or the breathing tube placed in your throat during surgery. If this causes discomfort, gargle with warm salt water. The discomfort should disappear within 24 hours.

## 2021-02-06 NOTE — Op Note (Signed)
Operative Note  02/06/2021  9:21 AM  PATIENT:  Kirsten Coleman  46 y.o. female  PRE-OPERATIVE DIAGNOSIS:  Menorrhagia, submucosal fibroid  POST-OPERATIVE DIAGNOSIS:  Menorrhagia, submucosal fibroid  PROCEDURE:  Procedure(s): HYSTEROSCOPY WITH MYOSURE EXCISION, DILATATION & CURETTAGE/ ENDOMETRIAL NOVASURE ABLATION  SURGEON:  Surgeon(s): Princess Bruins, MD  ANESTHESIA:   general with laryngeal mask  FINDINGS: Posterior submucosal myoma.  Uterine cavity otherwise normal.  Both Ostia well seen.  DESCRIPTION OF OPERATION: Under general anesthesia with laryngeal mask, the patient is in lithotomy position.  She is prepped with Betadine on the suprapubic, vulvar and vaginal areas.  The bladder was catheterized.  She is draped as usual.  Timeout is done.  The vaginal exam reveals a retroverted uterus, normal volume.  No adnexal mass.  The speculum is inserted in the vagina and the anterior lip of the cervix is grasped with a tenaculum.  A paracervical block is done with Nesacaine 1% a total of 20 cc at 4 and 8:00.  The hysterometry is at 8 cm with a cervical length at 3.5 cm for the uterine cavity measured at 4.5 cm.  We then dilated the cervix with Kennon Rounds dilators up to #23 without difficulty.  The hysteroscope was inserted in the intra uterine cavity.  Inspection reveals 2 normal ostia.  A posterior submucosal fibroid is present measuring about 1.5 cm.  Pictures are taken.  The XL MyoSure instrument is inserted in the hysteroscope.  We proceeded with excision of the submucosal fibroid.  We then remove the hysteroscope with the MyoSure.  A systematic curettage of the intra uterine cavity is done with a small sharp curette on all surfaces.  The curette is removed.  The endometrial curettings are sent together with the submucosal myoma excision material to pathology.  We then further dilate the cervix with Pratt dilators to #27.  The NovaSure instrument is easily inserted in the intra uterine  cavity.  The width of the cavity is measured at 3.2 cm.  Security test is done and passed.  We then proceeded with the NovaSure endometrial ablation which lasted 1 minute and 23 seconds.  The NovaSure instrument is then removed.  We confirmed Byrnett endometrium on the device as expected.  The tenaculum is removed.  Hemostasis is adequate.  The speculum is removed.  The patient is brought to recovery room in good and stable status.  ESTIMATED BLOOD LOSS: 5 mL Fluid Deficit: 300 mL  Intake/Output Summary (Last 24 hours) at 02/06/2021 1308 Last data filed at 02/06/2021 0914 Gross per 24 hour  Intake 100 ml  Output 5 ml  Net 95 ml     BLOOD ADMINISTERED:none   LOCAL MEDICATIONS USED:  Nesacaine 1% 20 cc for Paracervical block  SPECIMEN:  Source of Specimen:  Submucosal myoma excision specimen and Endometrial Curettings  DISPOSITION OF SPECIMEN:  PATHOLOGY  COUNTS:  YES  PLAN OF CARE: Transfer to PACU  Marie-Lyne LavoieMD9:21 AM

## 2021-02-06 NOTE — Anesthesia Preprocedure Evaluation (Addendum)
Anesthesia Evaluation  Patient identified by MRN, date of birth, ID band Patient awake    Reviewed: Allergy & Precautions, NPO status , Patient's Chart, lab work & pertinent test results  Airway Mallampati: I  TM Distance: >3 FB Neck ROM: Full    Dental no notable dental hx.    Pulmonary neg pulmonary ROS,    Pulmonary exam normal breath sounds clear to auscultation       Cardiovascular negative cardio ROS Normal cardiovascular exam Rhythm:Regular Rate:Normal     Neuro/Psych  Headaches, negative psych ROS   GI/Hepatic negative GI ROS, Neg liver ROS,   Endo/Other  negative endocrine ROS  Renal/GU negative Renal ROS     Musculoskeletal  (+) Arthritis ,   Abdominal   Peds  Hematology negative hematology ROS (+)   Anesthesia Other Findings Menorrhagia Submucosal fibroid  Reproductive/Obstetrics hcg negative                            Anesthesia Physical Anesthesia Plan  ASA: 1  Anesthesia Plan: General   Post-op Pain Management:    Induction: Intravenous  PONV Risk Score and Plan: 3 and Ondansetron, Dexamethasone, Midazolam and Treatment may vary due to age or medical condition  Airway Management Planned: LMA  Additional Equipment:   Intra-op Plan:   Post-operative Plan: Extubation in OR  Informed Consent: I have reviewed the patients History and Physical, chart, labs and discussed the procedure including the risks, benefits and alternatives for the proposed anesthesia with the patient or authorized representative who has indicated his/her understanding and acceptance.     Dental advisory given  Plan Discussed with: CRNA  Anesthesia Plan Comments:        Anesthesia Quick Evaluation

## 2021-02-06 NOTE — Anesthesia Procedure Notes (Signed)
Procedure Name: LMA Insertion Date/Time: 02/06/2021 8:46 AM Performed by: Genelle Bal, CRNA Pre-anesthesia Checklist: Patient identified, Emergency Drugs available, Suction available and Patient being monitored Patient Re-evaluated:Patient Re-evaluated prior to induction Oxygen Delivery Method: Circle system utilized Preoxygenation: Pre-oxygenation with 100% oxygen Induction Type: IV induction Ventilation: Mask ventilation without difficulty LMA: LMA inserted LMA Size: 4.0 Number of attempts: 1 Airway Equipment and Method: Bite block Placement Confirmation: positive ETCO2 Tube secured with: Tape Dental Injury: Teeth and Oropharynx as per pre-operative assessment

## 2021-02-06 NOTE — Transfer of Care (Signed)
Immediate Anesthesia Transfer of Care Note  Patient: Kirsten Coleman  Procedure(s) Performed: DILATATION & CURETTAGE/HYSTEROSCOPY WITH MYOSURE (Vagina ) POSSIBLE DILATATION & CURETTAGE/HYSTEROSCOPY WITH NOVASURE ABLATION (Vagina )  Patient Location: PACU  Anesthesia Type:General  Level of Consciousness: awake, alert  and oriented  Airway & Oxygen Therapy: Patient Spontanous Breathing and Patient connected to face mask oxygen  Post-op Assessment: Report given to RN and Post -op Vital signs reviewed and stable  Post vital signs: Reviewed and stable  Last Vitals:  Vitals Value Taken Time  BP 124/82 02/06/21 0924  Temp    Pulse 56 02/06/21 0929  Resp 10 02/06/21 0929  SpO2 100 % 02/06/21 0929  Vitals shown include unvalidated device data.  Last Pain:  Vitals:   02/06/21 0706  TempSrc: Oral  PainSc: 0-No pain      Patients Stated Pain Goal: 8 (36/64/40 3474)  Complications: No notable events documented.

## 2021-02-06 NOTE — Anesthesia Postprocedure Evaluation (Signed)
Anesthesia Post Note  Patient: BLONNIE MASKE  Procedure(s) Performed: DILATATION & CURETTAGE/HYSTEROSCOPY WITH MYOSURE AND NOVASURE ABLATION (Vagina )     Patient location during evaluation: PACU Anesthesia Type: General Level of consciousness: awake Pain management: pain level controlled Vital Signs Assessment: post-procedure vital signs reviewed and stable Respiratory status: spontaneous breathing, nonlabored ventilation, respiratory function stable and patient connected to nasal cannula oxygen Cardiovascular status: blood pressure returned to baseline and stable Postop Assessment: no apparent nausea or vomiting Anesthetic complications: no   No notable events documented.  Last Vitals:  Vitals:   02/06/21 1000 02/06/21 1034  BP: 133/78 133/85  Pulse: (!) 55 (!) 55  Resp: 14 18  Temp:    SpO2: 99% 99%    Last Pain:  Vitals:   02/06/21 1034  TempSrc:   PainSc: 3                  Tahari Clabaugh P [place] Knights

## 2021-02-06 NOTE — H&P (Addendum)
Kirsten Coleman is an 46 y.o. female. [id number]    RP: HSC/Myosure/D+C/Novasure Endometrial Ablation    HPI:  Progressively heavier menses with more uterine cramps.  No BTB.  Recently experienced painful cramping with IC.  No abnormal vaginal discharge.  Vasectomy for contraception.  Pelvic US showed a SM Myoma.   Pertinent Gynecological History: Menses: Heavy.  Currently on menses Contraception: vasectomy Blood transfusions: none Sexually transmitted diseases: no past history Last mammogram: normal  Last pap: normal   Menstrual History:  Patient's last menstrual period was 02/04/2021 (approximate).    Past Medical History:  Diagnosis Date   Fatty liver    sees dr Collene Mares for   Heavy periods 02/01/2021   Hisory of Migraine    none in 3 to 4 yrs per pt on 02-01-2021   History of COVID-19 01/2020   flu symptoms x 7 days all symptoms resolved   Menorrhagia    Pelvic pain    left side since march 2023   Wears glasses     Past Surgical History:  Procedure Laterality Date   adenoid removed     age 32   APPENDECTOMY  2002   [place] N/A 10/31/2014   Procedure: DILATATION & CURETTAGE/HYSTEROSCOPY WITH VERSAPOINT RESECTION;  Surgeon: Princess Bruins, MD;  Location: [facility];  Service: Gynecology;  Laterality: N/A;   TONSILLECTOMY     age 77   WISDOM TOOTH EXTRACTION     age 11    Family History  Problem Relation Age of Onset   Breast cancer Mother 67   Cancer Mother    Alzheimer's disease Other        grandfather   Lung cancer Other        uncle   Rheum arthritis Other        grandmother   Breast cancer Maternal Aunt 35   Breast cancer Maternal Grandmother     Social History:  reports that she has never smoked. She has never used smokeless tobacco. She reports that she does not currently use alcohol. She reports that she does not use drugs.  Allergies:  Allergies  Allergen Reactions   Bactrim  [Sulfamethoxazole-Trimethoprim] Rash   Chlorhexidine Rash    Medications Prior to Admission  Medication Sig Dispense Refill Last Dose   Ashwagandha 500 MG CAPS as directed.   Past Week   b complex vitamins tablet Take 1 tablet by mouth daily.   Past Week   Bitter Melon Extract 10 % POWD as directed. 1 tab per day   Past Week   cholecalciferol (VITAMIN D3) 25 MCG (1000 UNIT) tablet Take 1,000 Units by mouth daily.   Past Week   Coenzyme Q10 (CO Q 10 PO) Take 1 capsule by mouth daily.   Past Week   Misc Natural Products (GLUCOSAMINE CHOND COMPLEX/MSM PO) 3 tabs in am   Past Week   Multiple Vitamin (MULTIVITAMIN WITH MINERALS) TABS tablet Take 1 tablet by mouth daily.   Past Week   Omega-3 Fatty Acids (FISH OIL PO) Take 1 capsule by mouth daily.   Past Week   UNABLE TO FIND Bullet proof forbose (energy)   Past Week    REVIEW OF SYSTEMS: A ROS was performed and pertinent positives and negatives are included in the history.  GENERAL: No fevers or chills. HEENT: No change in vision, no earache, sore throat or sinus congestion. NECK: No pain or stiffness. CARDIOVASCULAR: No chest pain or pressure. No palpitations. PULMONARY: No shortness  of breath, cough or wheeze. GASTROINTESTINAL: No abdominal pain, nausea, vomiting or diarrhea, melena or bright red blood per rectum. GENITOURINARY: No urinary frequency, urgency, hesitancy or dysuria. MUSCULOSKELETAL: No joint or muscle pain, no back pain, no recent trauma. DERMATOLOGIC: No rash, no itching, no lesions. ENDOCRINE: No polyuria, polydipsia, no heat or cold intolerance. No recent change in weight. HEMATOLOGICAL: No anemia or easy bruising or bleeding. NEUROLOGIC: No headache, seizures, numbness, tingling or weakness. PSYCHIATRIC: No depression, no loss of interest in normal activity or change in sleep pattern.     Blood pressure 128/84, temperature 98.9 F (37.2 C), temperature source Oral, resp. rate 16, height 5' 4.75" (1.645 m), weight 74.3 kg, last  menstrual period 02/04/2021, SpO2 98 %.  Physical Exam:  Results for orders placed or performed during the hospital encounter of 02/06/21 (from the past 24 hour(s))  Pregnancy, urine POC     Status: None   Collection Time: 02/06/21  6:49 AM  Result Value Ref Range   Preg Test, Ur NEGATIVE NEGATIVE  CBC     Status: None   Collection Time: 02/06/21  7:15 AM  Result Value Ref Range   WBC 5.1 4.0 - 10.5 K/uL   RBC 4.56 3.87 - 5.11 MIL/uL   Hemoglobin 14.1 12.0 - 15.0 g/dL   HCT 41.9 36.0 - 46.0 %   MCV 91.9 80.0 - 100.0 fL   MCH 30.9 26.0 - 34.0 pg   MCHC 33.7 30.0 - 36.0 g/dL   RDW 13.2 11.5 - 15.5 %   Platelets 254 150 - 400 K/uL   nRBC 0.0 0.0 - 0.2 %     Pelvic US 12/27/2020: T/V images.  Comparison is made with previous scan in 2019.  Retroverted uterus normal in size and shape with several intramural and subserosal fibroids.  The largest fibroid is measured at 1.8 cm and is subserosal.  The 2 intramural fibroids are measured at 0.6 cm each.  No fibroids were seen on the previous scan.  1 fibroid is partly submucosal with distortion of the endometrial cavity posteriorly.  That fibroid is measured at 1.5 x 1.4 cm.  The overall uterine size is measured at 8.37 x 6.2 x 4.5 cm.  The endometrial lining is symmetrical measured at 6.3 mm with no mass or thickening seen.  Both ovaries are mobile, normal in size with normal follicular pattern and normal perfusion.  No adnexal mass.  No free fluid in the pelvis.     Assessment/Plan:  45 y.o. [id number]    1. Menorrhagia with regular cycle Progressively heavier menses with more uterine cramps.  No BTB.  Recently experienced painful cramping with IC.  No abnormal vaginal discharge.  Pelvic US findings thoroughly reviewed with patient.  New diagnosis of uterine fibroids.  1 partially submucosal fibroid is measured at 1.5 x 1.4 cm.  Given that that fibroid is probably the main cause of menorrhagia and uterine cramping, the decision was made to proceed  with excision of that fibroid under hysteroscopy.  We will schedule hysteroscopy with MyoSure XL excision of submucosal fibroid and dilation and curettage and Novasure Endometrial Ablation. Preop preparation, surgery with risks and benefits as well as postop precautions and expectations reviewed.  The risks of uterine perforation, infection, blood clots and anesthesia were discussed. Pamphlets were given at last visit.     2. Intramural, submucous, and subserous leiomyoma of uterus Schedule HSC/Myosure XL Excision, D+C, Novasure Endometrial Ablation.     3. Relies on partner vasectomy for contraception  Patient was counseled as to the risk of surgery to include the following:   1. Infection (prohylactic antibiotics will be administered)   2. DVT/Pulmonary Embolism (prophylactic pneumo compression stockings will be used)   3.Trauma to internal organs requiring additional surgical procedure to repair any injury to internal organs requiring perhaps additional hospitalization days.   4.Hemmorhage requiring transfusion and blood products which carry risks such as anaphylactic reaction, hepatitis and AIDS   Patient had received literature information on the procedure scheduled and all her questions were answered and fully accepts all risk.     Marie-Lyne Chastin Garlitz 02/06/2021, 8:29 AM

## 2021-02-07 ENCOUNTER — Encounter (HOSPITAL_BASED_OUTPATIENT_CLINIC_OR_DEPARTMENT_OTHER): Payer: Self-pay | Admitting: Obstetrics & Gynecology

## 2021-02-07 LAB — SURGICAL PATHOLOGY

## 2021-02-08 NOTE — Telephone Encounter (Signed)
Encounter closed

## 2021-02-14 ENCOUNTER — Ambulatory Visit (INDEPENDENT_AMBULATORY_CARE_PROVIDER_SITE_OTHER): Payer: BC Managed Care – PPO | Admitting: Obstetrics & Gynecology

## 2021-02-14 ENCOUNTER — Encounter: Payer: Self-pay | Admitting: Obstetrics & Gynecology

## 2021-02-14 ENCOUNTER — Other Ambulatory Visit: Payer: Self-pay

## 2021-02-14 VITALS — BP 118/70

## 2021-02-14 DIAGNOSIS — Z09 Encounter for follow-up examination after completed treatment for conditions other than malignant neoplasm: Secondary | ICD-10-CM

## 2021-02-14 NOTE — Progress Notes (Signed)
° ° °  Kirsten Coleman [birth year] [id number]        46 y.o.  [id number]   RP: Post HSC/Myosure excision/D+C on 02/06/2021  HPI: Very good postop evolution.  No pelvic pain.  No vaginal d/c.  No longer having any vaginal bleeding.  No fever.   OB History  Gravida Para Term Preterm AB Living  3 2     1 2   SAB IAB Ectopic Multiple Live Births  1            # Outcome Date GA Lbr Len/2nd Weight Sex Delivery Anes PTL Lv  3 SAB           2 Para           1 Para             Past medical history,surgical history, problem list, medications, allergies, family history and social history were all reviewed and documented in the EPIC chart.   Directed ROS with pertinent positives and negatives documented in the history of present illness/assessment and plan.  Exam:  Vitals:   02/14/21 1334  BP: 118/70   General appearance:  Normal  Abdomen: Normal  Gynecologic exam: Vulva normal.  Bimanual exam:  Uterus RV, mobile, NT.  No adnexal mass, NT.  No vaginal d/c or bleeding.  Patho 02/06/2021: FINAL MICROSCOPIC DIAGNOSIS:   A. ENDOMETRIUM, SUBMUCOSAL FIBROID, CURETTAGE:  Late secretory phase endometrium.  Fragments of myometrium are also seen which are compatible with clinical  history of submucosal leiomyoma.  No malignancy is seen.    Assessment/Plan:  46 y.o. [id number]   1. Status post gynecological surgery, follow-up exam Very good post op healing.  No Cx.  Patho benign, patient informed.    Other orders - CLENPIQ 10-3.5-12 MG-GM -GM/160ML SOLN; SMARTSIG:160 Milliliter(s) By Mouth Twice Daily (Patient not taking: Reported on 02/14/2021)   Princess Bruins MD, 1:44 PM 02/14/2021

## 2021-08-14 ENCOUNTER — Other Ambulatory Visit: Payer: Self-pay | Admitting: Obstetrics & Gynecology

## 2021-08-14 DIAGNOSIS — Z1231 Encounter for screening mammogram for malignant neoplasm of breast: Secondary | ICD-10-CM

## 2021-10-15 ENCOUNTER — Ambulatory Visit
Admission: RE | Admit: 2021-10-15 | Discharge: 2021-10-15 | Disposition: A | Discharge status: Home / Self Care | Payer: BC Managed Care – PPO | Source: Ambulatory Visit

## 2021-10-15 DIAGNOSIS — Z1231 Encounter for screening mammogram for malignant neoplasm of breast: Secondary | ICD-10-CM

## 2021-11-14 ENCOUNTER — Ambulatory Visit (INDEPENDENT_AMBULATORY_CARE_PROVIDER_SITE_OTHER): Payer: BC Managed Care – PPO | Admitting: Obstetrics & Gynecology

## 2021-11-14 ENCOUNTER — Encounter: Payer: Self-pay | Admitting: Obstetrics & Gynecology

## 2021-11-14 VITALS — BP 118/80 | HR 89 | Ht 64.5 in | Wt 160.0 lb

## 2021-11-14 DIAGNOSIS — Z01419 Encounter for gynecological examination (general) (routine) without abnormal findings: Secondary | ICD-10-CM

## 2021-11-14 DIAGNOSIS — Z9189 Other specified personal risk factors, not elsewhere classified: Secondary | ICD-10-CM

## 2021-11-14 NOTE — Progress Notes (Signed)
Kirsten Coleman 06-17-1975 [id number]   History:    46 y.o. Married G3P2A1L2 Married, Simon. Vasectomy.  New part-time job.  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    RP:  Established patient presenting for annual gyn exam    HPI: [place] Myosure excision of SM Myoma/D+C and Novasure Endometrial Ablation on 02/06/21.  Patho Benign.  Amenorrhea x Endometrial ablation except for minimal spotting x 1 about 2 months ago.  No pelvic pain.  No pain during IC.  Normal vaginal secretions.  Pap Neg  in 10/2020.  No h/o abnormal Pap.  Will repeat Pap at 3 years.  Breasts normal.  Mammo Neg 09/2021.  BMI 27.04.  Good fitness, hiking.  Health labs with Fam MD.  Father with h/o Colon Ca.  Colono with Dr Collene Mares 02/2021 with benign Polyps.  Colono every 3 years.  Dr Collene Mares Dxed Fatty Liver by [place], done for an elevated ALT.  Modified her diet and stopped R-OH, increased fitness activities.  Repeat ALT was normal in 02/2021.   Past medical history,surgical history, family history and social history were all reviewed and documented in the EPIC chart.  Gynecologic History No LMP recorded. (Menstrual status: Other).  Obstetric History OB History  Gravida Para Term Preterm AB Living  '3 2 2   1 2  '$ SAB IAB Ectopic Multiple Live Births  1            # Outcome Date GA Lbr Len/2nd Weight Sex Delivery Anes PTL Lv  3 SAB           2 Term           1 Term              ROS: A ROS was performed and pertinent positives and negatives are included in the history.  GENERAL: No fevers or chills. HEENT: No change in vision, no earache, sore throat or sinus congestion. NECK: No pain or stiffness. CARDIOVASCULAR: No chest pain or pressure. No palpitations. PULMONARY: No shortness of breath, cough or wheeze. GASTROINTESTINAL: No abdominal pain, nausea, vomiting or diarrhea, melena or bright red blood per rectum. GENITOURINARY: No urinary frequency, urgency, hesitancy or  dysuria. MUSCULOSKELETAL: No joint or muscle pain, no back pain, no recent trauma. DERMATOLOGIC: No rash, no itching, no lesions. ENDOCRINE: No polyuria, polydipsia, no heat or cold intolerance. No recent change in weight. HEMATOLOGICAL: No anemia or easy bruising or bleeding. NEUROLOGIC: No headache, seizures, numbness, tingling or weakness. PSYCHIATRIC: No depression, no loss of interest in normal activity or change in sleep pattern.     Exam:   BP 118/80   Pulse 89   Ht 5' 4.5" (1.638 m)   Wt 160 lb (72.6 kg)   SpO2 99%   BMI 27.04 kg/m   Body mass index is 27.04 kg/m.  General appearance : Well developed well nourished female. No acute distress HEENT: Eyes: no retinal hemorrhage or exudates,  Neck supple, trachea midline, no carotid bruits, no thyroidmegaly Lungs: Clear to auscultation, no rhonchi or wheezes, or rib retractions  Heart: Regular rate and rhythm, no murmurs or gallops Breast:Examined in sitting and supine position were symmetrical in appearance, no palpable masses or tenderness,  no skin retraction, no nipple inversion, no nipple discharge, no skin discoloration, no axillary or supraclavicular lymphadenopathy Abdomen: no palpable masses or tenderness, no rebound or guarding Extremities: no edema or skin discoloration or tenderness  Pelvic: Vulva: Normal  Vagina: No gross lesions or discharge  Cervix: No gross lesions or discharge  Uterus  RV, normal size, shape and consistency, non-tender and mobile  Adnexa  Without masses or tenderness  Anus: Normal   Assessment/Plan:  46 y.o. female for annual exam   1. 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  Patho Benign.  Amenorrhea x Endometrial ablation except for minimal spotting x 1 about 2 months ago.  No pelvic pain.  No pain during IC.  Normal vaginal secretions.  Pap Neg  in 10/2020.  No h/o abnormal Pap.  Will  repeat Pap at 3 years.  Breasts normal.  Mammo Neg 09/2021.  BMI 27.04.  Good fitness, hiking.  Health labs with Fam MD.  Father with h/o Colon Ca.  Colono with Dr Collene Mares 02/2021 with benign Polyps.  Colono every 3 years.  Dr Collene Mares Dxed Fatty Liver by [place], done for an elevated ALT.  Modified her diet and stopped R-OH, increased fitness activities.  Repeat ALT was normal in 02/2021.  2. Relies on partner vasectomy for contraception   Princess Bruins MD, 11:01 AM 11/14/2021

## 2022-09-23 ENCOUNTER — Other Ambulatory Visit: Payer: Self-pay | Admitting: Obstetrics and Gynecology

## 2022-09-23 DIAGNOSIS — Z1231 Encounter for screening mammogram for malignant neoplasm of breast: Secondary | ICD-10-CM

## 2022-10-28 ENCOUNTER — Ambulatory Visit
Admission: RE | Admit: 2022-10-28 | Discharge: 2022-10-28 | Disposition: A | Discharge status: Home / Self Care | Payer: BC Managed Care – PPO | Source: Ambulatory Visit

## 2022-10-28 DIAGNOSIS — Z1231 Encounter for screening mammogram for malignant neoplasm of breast: Secondary | ICD-10-CM

## 2022-10-29 ENCOUNTER — Other Ambulatory Visit: Payer: Self-pay | Admitting: Obstetrics and Gynecology

## 2022-10-29 DIAGNOSIS — N63 Unspecified lump in unspecified breast: Secondary | ICD-10-CM

## 2022-11-13 ENCOUNTER — Ambulatory Visit
Admission: RE | Admit: 2022-11-13 | Discharge: 2022-11-13 | Disposition: A | Discharge status: Home / Self Care | Payer: BC Managed Care – PPO | Source: Ambulatory Visit | Attending: Obstetrics and Gynecology | Admitting: Obstetrics and Gynecology

## 2022-11-13 DIAGNOSIS — N63 Unspecified lump in unspecified breast: Secondary | ICD-10-CM

## 2022-11-13 DIAGNOSIS — N6315 Unspecified lump in the right breast, overlapping quadrants: Secondary | ICD-10-CM | POA: Diagnosis not present

## 2022-11-17 ENCOUNTER — Ambulatory Visit: Payer: BC Managed Care – PPO | Admitting: Obstetrics and Gynecology

## 2022-11-26 ENCOUNTER — Encounter: Payer: Self-pay | Admitting: Obstetrics and Gynecology

## 2022-11-26 ENCOUNTER — Ambulatory Visit (INDEPENDENT_AMBULATORY_CARE_PROVIDER_SITE_OTHER): Payer: BC Managed Care – PPO | Admitting: Obstetrics and Gynecology

## 2022-11-26 ENCOUNTER — Other Ambulatory Visit (HOSPITAL_COMMUNITY)
Admission: RE | Admit: 2022-11-26 | Discharge: 2022-11-26 | Disposition: A | Discharge status: Home / Self Care | Payer: BC Managed Care – PPO | Source: Ambulatory Visit | Attending: Obstetrics and Gynecology | Admitting: Obstetrics and Gynecology

## 2022-11-26 VITALS — BP 120/76 | HR 78 | Ht 64.25 in | Wt 174.0 lb

## 2022-11-26 DIAGNOSIS — N814 Uterovaginal prolapse, unspecified: Secondary | ICD-10-CM | POA: Diagnosis not present

## 2022-11-26 DIAGNOSIS — Z01419 Encounter for gynecological examination (general) (routine) without abnormal findings: Secondary | ICD-10-CM | POA: Insufficient documentation

## 2022-11-26 DIAGNOSIS — N393 Stress incontinence (female) (male): Secondary | ICD-10-CM | POA: Diagnosis not present

## 2022-11-26 MED ORDER — ESTRADIOL 0.1 MG/GM VA CREA: 1.0000 | TOPICAL_CREAM | Freq: Every day | VAGINAL | 12 refills | Status: DC

## 2022-11-26 NOTE — Progress Notes (Signed)
Kirsten Coleman 08-Sep-1975 [id number]   History:    47 y.o. Married G3P2A1L2 Married, Simon. Vasectomy.  New part-time job.  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    RP:  Established patient presenting for annual gyn exam    HPI: [facility] Myosure excision of SM Myoma/D+C and Novasure Endometrial Ablation on 02/06/21.  Patho Benign.  Amenorrhea x Endometrial ablation except for minimal spotting x 1 about 2 months ago.  No pelvic pain.  No pain during IC.  Normal vaginal secretions.  Pap Neg  in 10/2020.  No h/o abnormal Pap.  Will repeat Pap at 3 years.  Breasts normal.  Mammo Neg 10/24 birads 2. Body mass index is 29.64 kg/m. Marland Kitchen  Good fitness, hiking.  Health labs with Fam MD.  Father with h/o Colon Ca.  Colono with Dr Loreta Ave 02/2021 with benign Polyps.  Colono every 3 years.  Dr Loreta Ave Dxed Fatty Liver by [place], done for an elevated ALT.  Modified her diet and stopped R-OH, increased fitness activities.  Repeat ALT was normal in 02/2021.  Has SUI incontinence and uirnary incontinence with working out and feels a bulge and that her cervix has dropped And is bothered by that.  No VB with ablation.  Does feel her cramps before when she would bleed.     Component Value Date/Time   DIAGPAP  11/13/2020 1552    - Negative for intraepithelial lesion or malignancy (NILM)   ADEQPAP  11/13/2020 1552    Satisfactory for evaluation; transformation zone component PRESENT.    High Risk HPV: Positive  Adequacy:  Satisfactory for evaluation, transformation zone component PRESENT  Diagnosis:  Atypical squamous cells of undetermined significance (ASC-US)  Past Medical History:  Diagnosis Date   Fatty liver    sees dr Loreta Ave for   Heavy periods 02/01/2021   Hisory of Migraine    none in 3 to 4 yrs per pt on 02-01-2021   History of COVID-19 01/2020   flu symptoms x 7 days all symptoms resolved   Menorrhagia    Pelvic pain    left side since march  2023   Wears glasses     Past Surgical History:  Procedure Laterality Date   adenoid removed     age 25   APPENDECTOMY  2002   DILATATION & CURETTAGE/HYSTEROSCOPY WITH MYOSURE N/A 02/06/2021   Procedure: DILATATION & CURETTAGE/HYSTEROSCOPY WITH MYOSURE AND NOVASURE ABLATION;  Surgeon: Genia Del, MD;  Location: [facility];  Service: Gynecology;  Laterality: N/A;   DILITATION & CURRETTAGE/HYSTROSCOPY WITH VERSAPOINT RESECTION N/A 10/31/2014   Procedure: DILATATION & CURETTAGE/HYSTEROSCOPY WITH VERSAPOINT RESECTION;  Surgeon: Genia Del, MD;  Location: [facility];  Service: Gynecology;  Laterality: N/A;   TONSILLECTOMY     age 43   WISDOM TOOTH EXTRACTION     age 85    Social History   Socioeconomic History   Marital status: Married    Spouse name: Simon   Number of children: 2   Years of education: BS   Highest education level: Not on file  Occupational History    Employer: [employer]    Comment: [employer]  Tobacco Use   Smoking status: Never   Smokeless tobacco: Never  Vaping Use   Vaping status: Never Used  Substance and Sexual Activity   Alcohol use: Not Currently   Drug use: No   Sexual activity: Yes    Partners: Male  Birth control/protection: Other-see comments    Comment: intercourse age 60,less than 5 sexual partners, husband vasectomy, ablation  Other Topics Concern   Not on file  Social History Narrative   Patient lives at home with her husband [name]). Patient has college education. B.S.   Caffeine- None-   Right handed.   Social Determinants of Health   Financial Resource Strain: Not on file  Food Insecurity: Not on file  Transportation Needs: Not on file  Physical Activity: Not on file  Stress: Not on file  Social Connections: Not on file  Intimate Partner Violence: Not on file    Past medical history,surgical history, family history and social history were all reviewed and documented in the EPIC chart.  Gynecologic  History No LMP recorded. (Menstrual status: Other).  Obstetric History OB History  Gravida Para Term Preterm AB Living  3 2 2   1 2   SAB IAB Ectopic Multiple Live Births  1            # Outcome Date GA Lbr Len/2nd Weight Sex Type Anes PTL Lv  3 SAB           2 Term           1 Term              ROS: A ROS was performed and pertinent positives and negatives are included in the history.  GENERAL: No fevers or chills. HEENT: No change in vision, no earache, sore throat or sinus congestion. NECK: No pain or stiffness. CARDIOVASCULAR: No chest pain or pressure. No palpitations. PULMONARY: No shortness of breath, cough or wheeze. GASTROINTESTINAL: No abdominal pain, nausea, vomiting or diarrhea, melena or bright red blood per rectum. GENITOURINARY: No urinary frequency, urgency, hesitancy or dysuria. MUSCULOSKELETAL: No joint or muscle pain, no back pain, no recent trauma. DERMATOLOGIC: No rash, no itching, no lesions. ENDOCRINE: No polyuria, polydipsia, no heat or cold intolerance. No recent change in weight. HEMATOLOGICAL: No anemia or easy bruising or bleeding. NEUROLOGIC: No headache, seizures, numbness, tingling or weakness. PSYCHIATRIC: No depression, no loss of interest in normal activity or change in sleep pattern.     Exam:   There were no vitals taken for this visit.  There is no height or weight on file to calculate BMI.  General appearance : Well developed well nourished female. No acute distress HEENT: Eyes: no retinal hemorrhage or exudates,  Neck supple, trachea midline, no carotid bruits, no thyroidmegaly Lungs: Clear to auscultation, no rhonchi or wheezes, or rib retractions  Heart: Regular rate and rhythm, no murmurs or gallops Breast:Examined in sitting and supine position were symmetrical in appearance, no palpable masses or tenderness,  no skin retraction, no nipple inversion, no nipple discharge, no skin discoloration, no axillary or supraclavicular  lymphadenopathy Abdomen: no palpable masses or tenderness, no rebound or guarding Extremities: no edema or skin discoloration or tenderness  Pelvic: Vulva: Normal             Vagina: No gross lesions or discharge  Cervix: No gross lesions or discharge, with prolapse and stage 2 cystocele  Uterus  RV, normal size 7 cm, shape and consistency, non-tender and mobile  Adnexa  Without masses or tenderness  Anus: Normal   Assessment/Plan:  47 y.o. female for annual exam, uterine prolapse, SUI, dyspareunia, atrophic vaginitis  1. 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  Patho Benign.  Amenorrhea x Endometrial  ablation except for minimal spotting x 1 about 2 months ago.  No pelvic pain.  No pain during IC.  Normal vaginal secretions.  Pap Neg  in 10/2020.  No h/o abnormal Pap.  Will repeat Pap at 3 years.  Breasts normal.  Mammo Neg 09/2021.  Body mass index is 29.64 kg/m.Marland Kitchen  Good fitness, hiking.  Health labs with Fam MD.  Father with h/o Colon Ca.  Colono with Dr Loreta Ave 02/2021 with benign Polyp x1 repeat in 5 years.  Dr Loreta Ave Dxed Fatty Liver by [place], done for an elevated ALT.  Modified her diet and stopped R-OH, increased fitness activities.  Repeat ALT was normal in 02/2021.  2. Relies on partner vasectomy for contraception  3. Urogyn consult for evaluation and management of prolapse and SUI.  Begin estrogen for her bladder and atrophic vaginitis  Earley Favor MD, 2:49 PM 11/26/2022

## 2022-11-28 LAB — CYTOLOGY - PAP
Comment: NEGATIVE
Diagnosis: NEGATIVE
High risk HPV: NEGATIVE
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
## 2023-02-10 ENCOUNTER — Encounter: Payer: Self-pay | Admitting: Obstetrics

## 2023-02-10 ENCOUNTER — Ambulatory Visit (INDEPENDENT_AMBULATORY_CARE_PROVIDER_SITE_OTHER): Payer: BC Managed Care – PPO | Admitting: Obstetrics

## 2023-02-10 VITALS — BP 141/79 | HR 59 | Ht 64.57 in | Wt 174.0 lb

## 2023-02-10 DIAGNOSIS — N393 Stress incontinence (female) (male): Secondary | ICD-10-CM | POA: Diagnosis not present

## 2023-02-10 DIAGNOSIS — N941 Unspecified dyspareunia: Secondary | ICD-10-CM | POA: Insufficient documentation

## 2023-02-10 DIAGNOSIS — N8110 Cystocele, unspecified: Secondary | ICD-10-CM | POA: Diagnosis not present

## 2023-02-10 DIAGNOSIS — N952 Postmenopausal atrophic vaginitis: Secondary | ICD-10-CM | POA: Insufficient documentation

## 2023-02-10 LAB — POCT URINALYSIS DIPSTICK
Bilirubin, UA: NEGATIVE
Blood, UA: NEGATIVE
Glucose, UA: NEGATIVE
Ketones, UA: NEGATIVE
Leukocytes, UA: NEGATIVE
Nitrite, UA: NEGATIVE
Protein, UA: NEGATIVE
Spec Grav, UA: 1.01 (ref 1.010–1.025)
Urobilinogen, UA: 0.2 U/dL
pH, UA: 6 (ref 5.0–8.0)

## 2023-02-10 NOTE — Assessment & Plan Note (Signed)
-   POCT UA negative, PVR 12mL - improved with low dose vaginal estrogen, encouraged to continue - For treatment of stress urinary incontinence,  non-surgical options include expectant management, weight loss, physical therapy, as well as a pessary.  Surgical options include a midurethral sling, Burch urethropexy, and transurethral injection of a bulking agent. - encouraged Kegel exercises with instructions provided, consider pelvic floor PT

## 2023-02-10 NOTE — Assessment & Plan Note (Signed)
-   pelvic floor muscle pain on exam R > L - The origin of pelvic floor muscle spasm can be multifactorial, including primary, reactive to a different pain source, trauma, or even part of a centralized pain syndrome.Treatment options include pelvic floor physical therapy, local (vaginal) or oral  muscle relaxants, pelvic muscle trigger point injections or centrally acting pain medications.   - encouraged to continue lubrication use and position change as needed for comfort

## 2023-02-10 NOTE — Assessment & Plan Note (Signed)
-   For symptomatic vaginal atrophy options include lubrication with a water-based lubricant, personal hygiene measures and barrier protection against wetness, and estrogen replacement in the form of vaginal cream, vaginal tablets, or a time-released vaginal ring.   - increase vaginal estrogen dose to 1g twice a week

## 2023-02-10 NOTE — Assessment & Plan Note (Signed)
-   For treatment of pelvic organ prolapse, we discussed options for management including expectant management, conservative management, and surgical management, such as Kegels, a pessary, pelvic floor physical therapy, and specific surgical procedures. - some improvement with vaginal estrogen, continue twice a week - start Kegel exercises - consider pelvic floor PT

## 2023-02-10 NOTE — Patient Instructions (Signed)
You have a stage 2 (out of 4) prolapse.  We discussed the fact that it is not life threatening but there are several treatment options. For treatment of pelvic organ prolapse, we discussed options for management including expectant management, conservative management, and surgical management, such as Kegels, a pessary, pelvic floor physical therapy, and specific surgical procedures.     For treatment of stress urinary incontinence, which is leakage with physical activity/movement/strainging/coughing, we discussed expectant management versus nonsurgical options versus surgery. Nonsurgical options include weight loss, physical therapy, as well as a pessary.  Surgical options include a midurethral sling, which is a synthetic mesh sling that acts like a hammock under the urethra to prevent leakage of urine, a Burch urethropexy, and transurethral injection of a bulking agent.   For symptomatic vaginal atrophy options include lubrication with a water-based lubricant, personal hygiene measures and barrier protection against wetness, and estrogen replacement in the form of vaginal cream, vaginal tablets, or a time-released vaginal ring.     Continue vaginal estrogen twice a week.   The origin of pelvic floor muscle spasm can be multifactorial, including primary, reactive to a different pain source, trauma, or even part of a centralized pain syndrome.Treatment options include pelvic floor physical therapy, local (vaginal) or oral  muscle relaxants, pelvic muscle trigger point injections or centrally acting pain medications.

## 2023-02-16 ENCOUNTER — Ambulatory Visit: Payer: BC Managed Care – PPO | Admitting: Obstetrics

## 2023-03-24 ENCOUNTER — Ambulatory Visit: Payer: BC Managed Care – PPO | Admitting: Obstetrics

## 2023-05-11 DIAGNOSIS — M9905 Segmental and somatic dysfunction of pelvic region: Secondary | ICD-10-CM | POA: Diagnosis not present

## 2023-05-11 DIAGNOSIS — M9902 Segmental and somatic dysfunction of thoracic region: Secondary | ICD-10-CM | POA: Diagnosis not present

## 2023-05-11 DIAGNOSIS — M9903 Segmental and somatic dysfunction of lumbar region: Secondary | ICD-10-CM | POA: Diagnosis not present

## 2023-05-11 DIAGNOSIS — M62830 Muscle spasm of back: Secondary | ICD-10-CM | POA: Diagnosis not present

## 2023-05-13 DIAGNOSIS — M9902 Segmental and somatic dysfunction of thoracic region: Secondary | ICD-10-CM | POA: Diagnosis not present

## 2023-05-13 DIAGNOSIS — M9905 Segmental and somatic dysfunction of pelvic region: Secondary | ICD-10-CM | POA: Diagnosis not present

## 2023-05-13 DIAGNOSIS — M62830 Muscle spasm of back: Secondary | ICD-10-CM | POA: Diagnosis not present

## 2023-05-13 DIAGNOSIS — M9903 Segmental and somatic dysfunction of lumbar region: Secondary | ICD-10-CM | POA: Diagnosis not present

## 2023-05-15 DIAGNOSIS — M9905 Segmental and somatic dysfunction of pelvic region: Secondary | ICD-10-CM | POA: Diagnosis not present

## 2023-05-15 DIAGNOSIS — M62830 Muscle spasm of back: Secondary | ICD-10-CM | POA: Diagnosis not present

## 2023-05-15 DIAGNOSIS — M9902 Segmental and somatic dysfunction of thoracic region: Secondary | ICD-10-CM | POA: Diagnosis not present

## 2023-05-15 DIAGNOSIS — M9903 Segmental and somatic dysfunction of lumbar region: Secondary | ICD-10-CM | POA: Diagnosis not present

## 2023-05-20 DIAGNOSIS — M9905 Segmental and somatic dysfunction of pelvic region: Secondary | ICD-10-CM | POA: Diagnosis not present

## 2023-05-20 DIAGNOSIS — M62830 Muscle spasm of back: Secondary | ICD-10-CM | POA: Diagnosis not present

## 2023-05-20 DIAGNOSIS — M9903 Segmental and somatic dysfunction of lumbar region: Secondary | ICD-10-CM | POA: Diagnosis not present

## 2023-05-20 DIAGNOSIS — M9902 Segmental and somatic dysfunction of thoracic region: Secondary | ICD-10-CM | POA: Diagnosis not present

## 2023-09-04 ENCOUNTER — Other Ambulatory Visit: Payer: Self-pay | Admitting: Obstetrics and Gynecology

## 2023-09-04 DIAGNOSIS — Z1231 Encounter for screening mammogram for malignant neoplasm of breast: Secondary | ICD-10-CM

## 2023-11-09 ENCOUNTER — Ambulatory Visit

## 2023-12-02 ENCOUNTER — Ambulatory Visit (INDEPENDENT_AMBULATORY_CARE_PROVIDER_SITE_OTHER): Admitting: Obstetrics and Gynecology

## 2023-12-02 ENCOUNTER — Encounter: Payer: Self-pay | Admitting: Obstetrics and Gynecology

## 2023-12-02 VITALS — BP 126/78 | HR 74 | Ht 63.78 in | Wt 160.8 lb

## 2023-12-02 DIAGNOSIS — Z01419 Encounter for gynecological examination (general) (routine) without abnormal findings: Secondary | ICD-10-CM | POA: Diagnosis not present

## 2023-12-02 DIAGNOSIS — D229 Melanocytic nevi, unspecified: Secondary | ICD-10-CM

## 2023-12-02 DIAGNOSIS — N951 Menopausal and female climacteric states: Secondary | ICD-10-CM

## 2023-12-02 DIAGNOSIS — Z1331 Encounter for screening for depression: Secondary | ICD-10-CM

## 2023-12-02 DIAGNOSIS — Z1231 Encounter for screening mammogram for malignant neoplasm of breast: Secondary | ICD-10-CM

## 2023-12-02 MED ORDER — ESTRADIOL 0.01 % VA CREA: 1.0000 | TOPICAL_CREAM | VAGINAL | 6 refills | Status: AC

## 2023-12-02 NOTE — Patient Instructions (Signed)
 Perimenopause/Menopause suggestions   You should be getting 1,200 mg of calcium a day between your diet and supplements and at least 3000 IU a day of Vit D. You should exercise regularly with weight bearing exercises. I would recommend a bone density q 2 years.  We loose 5% per year in this time period of our bone density, due to the loss of estrogen. Magnesium L-threonate at bedtime for our sleep and bones (follow recommended dose on bottle)  Please read The New Menopause my Dr. Ronal Estefana Morton and Estrogen Matters  Try to avoid caffeine and alcohol in this period, as they can make symptoms worse  Continue annual mammograms, unless told sooner  Please reach out with any questions Almarie MARLA Carpen

## 2023-12-02 NOTE — Addendum Note (Signed)
 Addended by: GLENNON ALMARIE POUR on: 12/02/2023 04:35 PM   Modules accepted: Orders

## 2023-12-02 NOTE — Progress Notes (Signed)
 48 y.o. y.o. female here for annual exam. No LMP recorded. Patient has had an ablation.   [id number] Married, Kirsten Coleman. Vasectomy.  New part-time job.  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    RP:  Established patient presenting for annual gyn exam    HPI: HSC Myosure excision of SM Myoma/D+C and Novasure Endometrial Ablation on 02/06/21.  Patho Benign.  Amenorrhea x Endometrial ablation except for minimal spotting x 1 about 2 months ago.  No pelvic pain.  No pain during IC.  Normal vaginal secretions.  Pap Neg  in 10/2020.  No h/o abnormal Pap.  Will repeat Pap at 3 years.  Breasts normal.  Mammo Neg 10/24 birads 2. Body mass index is 29.64 kg/m. SABRA  Good fitness, hiking.  Health labs with Fam MD.  Father with h/o Colon Ca.  Colono with Dr Kristie 02/2021 with benign Polyps.  Colono every 3 years.  Dr Kristie Dxed Fatty Liver by US , done for an elevated ALT.  Modified her diet and stopped R-OH, increased fitness activities.  Repeat ALT was normal in 02/2021.  Has SUI incontinence and uirnary incontinence with working out and feels a bulge and that her cervix has dropped-  this is better with the estrogen cream.  Has been seen with urogyn.  PAP-11/26/22- Normal Mammo.-11/13/22 Colonoscopy-02/22/21 No VB with ablation.  Reports some vasomotor symptoms Some brain fog  Dermatology: annual mole check referral placed Offered calcium score test declines today  Body mass index is 27.79 kg/m.     12/02/2023    3:09 PM  Depression screen PHQ 2/9  Decreased Interest 0  Down, Depressed, Hopeless 0  PHQ - 2 Score 0    Blood pressure 126/78, pulse 74, height 5' 3.78 (1.62 m), weight 160 lb 12.8 oz (72.9 kg), SpO2 98%.     Component Value Date/Time   DIAGPAP  11/26/2022 1618    - Negative for intraepithelial lesion or malignancy (NILM)   DIAGPAP  11/13/2020 1552    - Negative for intraepithelial lesion or malignancy (NILM)   HPVHIGH Negative  11/26/2022 1618   ADEQPAP  11/26/2022 1618    Satisfactory for evaluation; transformation zone component PRESENT.   ADEQPAP  11/13/2020 1552    Satisfactory for evaluation; transformation zone component PRESENT.    GYN HISTORY:    Component Value Date/Time   DIAGPAP  11/26/2022 1618    - Negative for intraepithelial lesion or malignancy (NILM)   DIAGPAP  11/13/2020 1552    - Negative for intraepithelial lesion or malignancy (NILM)   HPVHIGH Negative 11/26/2022 1618   ADEQPAP  11/26/2022 1618    Satisfactory for evaluation; transformation zone component PRESENT.   ADEQPAP  11/13/2020 1552    Satisfactory for evaluation; transformation zone component PRESENT.    OB History  Gravida Para Term Preterm AB Living  3 2 0 2 1 2   SAB IAB Ectopic Multiple Live Births  1    2    # Outcome Date GA Lbr Len/2nd Weight Sex Type Anes PTL Lv  3 Preterm     M Vag-Spont   LIV  2 Preterm     F CS-Vac   LIV  1 SAB             Past Medical History:  Diagnosis Date   Fatty liver    sees dr kristie for   Heavy periods 02/01/2021   Hisory of Migraine  none in 3 to 4 yrs per pt on 02-01-2021   History of COVID-19 01/2020   flu symptoms x 7 days all symptoms resolved   Menorrhagia    Pelvic pain    left side since march 2023   Wears glasses     Past Surgical History:  Procedure Laterality Date   adenoid removed     age 54   APPENDECTOMY  2002   DILATATION & CURETTAGE/HYSTEROSCOPY WITH MYOSURE N/A 02/06/2021   Procedure: DILATATION & CURETTAGE/HYSTEROSCOPY WITH MYOSURE AND NOVASURE ABLATION;  Surgeon: Lavoie, Marie-Lyne, MD;  Location: [facility];  Service: Gynecology;  Laterality: N/A;   DILITATION & CURRETTAGE/HYSTROSCOPY WITH VERSAPOINT RESECTION N/A 10/31/2014   Procedure: DILATATION & CURETTAGE/HYSTEROSCOPY WITH VERSAPOINT RESECTION;  Surgeon: Percilla Burly, MD;  Location: [facility];  Service: Gynecology;  Laterality: N/A;   TONSILLECTOMY     age 73   WISDOM TOOTH  EXTRACTION     age 54    Current Outpatient Medications on File Prior to Visit  Medication Sig Dispense Refill   Ashwagandha 500 MG CAPS as directed.     b complex vitamins tablet Take 1 tablet by mouth daily.     Bitter Melon Extract 10 % POWD as directed. 1 tab per day     cholecalciferol (VITAMIN D3) 25 MCG (1000 UNIT) tablet Take 1,000 Units by mouth daily.     Coenzyme Q10 (CO Q 10 PO) Take 1 capsule by mouth daily.     CVS TRIPLE MAGNESIUM COMPLEX PO Take by mouth.     estradiol  (ESTRACE  VAGINAL) 0.1 MG/GM vaginal cream Place 1 Applicatorful vaginally at bedtime. Rub a pea size amount into the vagina at bedtime for 3 weeks then continue for 3 times a week there after 42.5 g 12   UNABLE TO FIND Med Name: 1-TDC  supplement     No current facility-administered medications on file prior to visit.    Social History   Socioeconomic History   Marital status: Married    Spouse name: Kirsten Coleman   Number of children: 2   Years of education: BS   Highest education level: Not on file  Occupational History    Employer: [employer]    Comment: [employer]  Tobacco Use   Smoking status: Never   Smokeless tobacco: Never  Vaping Use   Vaping status: Never Used  Substance and Sexual Activity   Alcohol use: Yes    Comment: occ.   Drug use: No   Sexual activity: Yes    Partners: Male    Birth control/protection: Other-see comments    Comment: intercourse age 84,less than 5 sexual partners, husband vasectomy, ablation  Other Topics Concern   Not on file  Social History Narrative   Patient lives at home with her husband Kirsten Coleman). Patient has college education. B.S.   Caffeine- None-   Right handed.   Social Drivers of Corporate Investment Banker Strain: Not on file  Food Insecurity: Not on file  Transportation Needs: Not on file  Physical Activity: Not on file  Stress: Not on file  Social Connections: Not on file  Intimate Partner Violence: Not on file    Family History  Problem  Relation Age of Onset   Breast cancer Mother 25   Breast cancer Maternal Grandmother    Breast cancer Maternal Aunt 1   Alzheimer's disease Other        grandfather   Lung cancer Other  uncle   Rheum arthritis Other        grandmother   Uterine cancer Neg Hx    Bladder Cancer Neg Hx      Allergies  Allergen Reactions   Bactrim [Sulfamethoxazole-Trimethoprim] Rash   Chlorhexidine  Rash      Patient's last menstrual period was No LMP recorded. Patient has had an ablation..           Review of Systems Alls systems reviewed and are negative.     Physical Exam Constitutional:      Appearance: Normal appearance.  Genitourinary:     Vulva and urethral meatus normal.     No lesions in the vagina.     Right Labia: No rash, lesions or skin changes.    Left Labia: No lesions, skin changes or rash.    No vaginal discharge or tenderness.     No vaginal prolapse present.    No vaginal atrophy present.     Right Adnexa: not tender, not palpable and no mass present.    Left Adnexa: not tender, not palpable and no mass present.    No cervical motion tenderness or discharge.     Uterus is not enlarged, tender or irregular.  Breasts:    Right: Normal.     Left: Normal.  HENT:     Head: Normocephalic.  Neck:     Thyroid: No thyroid mass, thyromegaly or thyroid tenderness.  Cardiovascular:     Rate and Rhythm: Normal rate and regular rhythm.     Heart sounds: Normal heart sounds, S1 normal and S2 normal.  Pulmonary:     Effort: Pulmonary effort is normal.     Breath sounds: Normal breath sounds and air entry.  Abdominal:     General: There is no distension.     Palpations: Abdomen is soft. There is no mass.     Tenderness: There is no abdominal tenderness. There is no guarding or rebound.  Musculoskeletal:        General: Normal range of motion.     Cervical back: Full passive range of motion without pain, normal range of motion and neck supple. No tenderness.      Right lower leg: No edema.     Left lower leg: No edema.  Neurological:     Mental Status: She is alert.  Skin:    General: Skin is warm.  Psychiatric:        Mood and Affect: Mood normal.        Behavior: Behavior normal.        Thought Content: Thought content normal.  Vitals and nursing note reviewed. Exam conducted with a chaperone present.       A:         Well Woman GYN exam                             P:        Pap smear not indicated Encouraged annual mammogram screening Colon cancer screening up-to-date DXA not indicated Labs and immunizations ordered today Encouraged healthy lifestyle practices Encouraged Vit D and Calcium  Refill given on vaginal estrace  cream  No follow-ups on file.  Kirsten Coleman

## 2023-12-09 ENCOUNTER — Telehealth: Payer: Self-pay | Admitting: *Deleted

## 2023-12-09 ENCOUNTER — Other Ambulatory Visit: Payer: Self-pay | Admitting: Obstetrics and Gynecology

## 2023-12-09 NOTE — Addendum Note (Signed)
 Addended by: BRUTUS KATE SAILOR on: 12/09/2023 04:36 PM   Modules accepted: Orders

## 2023-12-09 NOTE — Telephone Encounter (Signed)
 Call transferred from billing.  Patient was seen in office on 12/02/23 for AEX. Patient states she left and read AVS, states she was unaware labs were to be drawn prior to leaving.   Lab appt scheduled for 12/10/23 at 0830.   Lab orders updated to future orders in AEX 12/02/23 encounter to include updated estradiol  lab. Orders pended.   Dr. Glennon please review updated orders in AEX 12/02/23 and sign

## 2023-12-10 ENCOUNTER — Other Ambulatory Visit

## 2023-12-11 ENCOUNTER — Ambulatory Visit
Admission: RE | Admit: 2023-12-11 | Discharge: 2023-12-11 | Disposition: A | Discharge status: Home / Self Care | Source: Ambulatory Visit | Attending: Obstetrics and Gynecology | Admitting: Obstetrics and Gynecology

## 2023-12-11 ENCOUNTER — Ambulatory Visit: Payer: Self-pay | Admitting: Obstetrics and Gynecology

## 2023-12-11 DIAGNOSIS — Z1231 Encounter for screening mammogram for malignant neoplasm of breast: Secondary | ICD-10-CM

## 2023-12-11 DIAGNOSIS — Z01419 Encounter for gynecological examination (general) (routine) without abnormal findings: Secondary | ICD-10-CM

## 2023-12-14 LAB — APOLIPOPROTEIN EVALUATION
APOLIPOPROTEIN B/A1 RATIO: 0.55 (ref <0.63)
Apolipoprotein A-1: 157 mg/dL (ref >=125)
Apolipoprotein B: 86 mg/dL (ref <90)

## 2023-12-15 LAB — COMPREHENSIVE METABOLIC PANEL WITH GFR
AG Ratio: 2.2 (calc) (ref 1.0–2.5)
ALT: 24 U/L (ref 6–29)
AST: 16 U/L (ref 10–35)
Albumin: 4.3 g/dL (ref 3.6–5.1)
Alkaline phosphatase (APISO): 40 U/L (ref 31–125)
BUN: 17 mg/dL (ref 7–25)
CO2: 26 mmol/L (ref 20–32)
Calcium: 9 mg/dL (ref 8.6–10.2)
Chloride: 103 mmol/L (ref 98–110)
Creat: 0.68 mg/dL (ref 0.50–0.99)
Globulin: 2 g/dL (ref 1.9–3.7)
Glucose, Bld: 85 mg/dL (ref 65–99)
Potassium: 4.4 mmol/L (ref 3.5–5.3)
Sodium: 137 mmol/L (ref 135–146)
Total Bilirubin: 0.6 mg/dL (ref 0.2–1.2)
Total Protein: 6.3 g/dL (ref 6.1–8.1)
eGFR: 107 mL/min/1.73m2 (ref >=60)

## 2023-12-15 LAB — CBC
HCT: 42.7 % (ref 35.0–45.0)
Hemoglobin: 14.2 g/dL (ref 11.7–15.5)
MCH: 31.2 pg (ref 27.0–33.0)
MCHC: 33.3 g/dL (ref 32.0–36.0)
MCV: 93.8 fL (ref 80.0–100.0)
MPV: 10.9 fL (ref 7.5–12.5)
Platelets: 266 Thousand/uL (ref 140–400)
RBC: 4.55 Million/uL (ref 3.80–5.10)
RDW: 12.6 % (ref 11.0–15.0)
WBC: 6.5 Thousand/uL (ref 3.8–10.8)

## 2023-12-15 LAB — LIPID PANEL
Cholesterol: 201 mg/dL — ABNORMAL HIGH (ref <200)
HDL: 70 mg/dL (ref >=50)
LDL Cholesterol (Calc): 116 mg/dL — ABNORMAL HIGH
Non-HDL Cholesterol (Calc): 131 mg/dL — ABNORMAL HIGH (ref <130)
Total CHOL/HDL Ratio: 2.9 (calc) (ref <5.0)
Triglycerides: 60 mg/dL (ref <150)

## 2023-12-15 LAB — HEMOGLOBIN A1C
Hgb A1c MFr Bld: 5 % (ref <5.7)
Mean Plasma Glucose: 97 mg/dL
eAG (mmol/L): 5.4 mmol/L

## 2023-12-15 LAB — TSH: TSH: 1.49 m[IU]/L

## 2023-12-15 LAB — FOLLICLE STIMULATING HORMONE: FSH: 6.9 m[IU]/mL

## 2023-12-15 LAB — TESTOS,TOTAL,FREE AND SHBG (FEMALE)
Free Testosterone: 2.3 pg/mL (ref 0.1–6.4)
Sex Hormone Binding: 39 nmol/L (ref 17–124)
Testosterone, Total, LC-MS-MS: 21 ng/dL (ref 2–45)

## 2023-12-15 LAB — VITAMIN D 25 HYDROXY (VIT D DEFICIENCY, FRACTURES): Vit D, 25-Hydroxy: 36 ng/mL (ref 30–100)

## 2024-02-01 ENCOUNTER — Encounter: Payer: Self-pay | Admitting: *Deleted

## 2024-07-14 ENCOUNTER — Ambulatory Visit: Admitting: Dermatology
